# Patient Record
Sex: Female | Born: 1945 | Race: White | Hispanic: No | Marital: Married | Smoking: Never smoker
Health system: Southern US, Community
[De-identification: ages and names within clinical notes are randomized; demographics above are authoritative.]

## PROBLEM LIST (undated history)

## (undated) DIAGNOSIS — R1013 Epigastric pain: Secondary | ICD-10-CM

## (undated) DIAGNOSIS — I10 Essential (primary) hypertension: Secondary | ICD-10-CM

## (undated) DIAGNOSIS — Z973 Presence of spectacles and contact lenses: Secondary | ICD-10-CM

## (undated) DIAGNOSIS — C50919 Malignant neoplasm of unspecified site of unspecified female breast: Secondary | ICD-10-CM

## (undated) DIAGNOSIS — M1990 Unspecified osteoarthritis, unspecified site: Secondary | ICD-10-CM

## (undated) DIAGNOSIS — E7800 Pure hypercholesterolemia, unspecified: Secondary | ICD-10-CM

## (undated) HISTORY — PX: ESOPHAGOGASTRODUODENOSCOPY: SHX1529

## (undated) HISTORY — PX: COLONOSCOPY: SHX174

## (undated) HISTORY — PX: TUBAL LIGATION: SHX77

## (undated) HISTORY — PX: ABDOMINAL HYSTERECTOMY: SHX81

---

## 1996-01-15 DIAGNOSIS — C50919 Malignant neoplasm of unspecified site of unspecified female breast: Secondary | ICD-10-CM

## 1996-01-15 HISTORY — DX: Malignant neoplasm of unspecified site of unspecified female breast: C50.919

## 1996-01-15 HISTORY — PX: MASTECTOMY: SHX3

## 2003-12-20 ENCOUNTER — Ambulatory Visit: Payer: Self-pay | Admitting: Internal Medicine

## 2004-01-24 ENCOUNTER — Ambulatory Visit: Payer: Self-pay | Admitting: Internal Medicine

## 2004-06-22 ENCOUNTER — Ambulatory Visit: Payer: Self-pay | Admitting: Internal Medicine

## 2004-08-13 ENCOUNTER — Ambulatory Visit: Payer: Self-pay | Admitting: General Surgery

## 2005-01-02 ENCOUNTER — Ambulatory Visit: Payer: Self-pay | Admitting: Internal Medicine

## 2005-03-01 ENCOUNTER — Ambulatory Visit: Payer: Self-pay | Admitting: General Surgery

## 2005-04-02 ENCOUNTER — Ambulatory Visit: Payer: Self-pay | Admitting: Internal Medicine

## 2005-06-27 ENCOUNTER — Ambulatory Visit: Payer: Self-pay | Admitting: Internal Medicine

## 2005-12-27 ENCOUNTER — Ambulatory Visit: Payer: Self-pay | Admitting: Internal Medicine

## 2006-03-28 ENCOUNTER — Ambulatory Visit: Payer: Self-pay | Admitting: General Surgery

## 2006-08-16 ENCOUNTER — Emergency Department: Payer: Self-pay | Admitting: Emergency Medicine

## 2006-08-17 ENCOUNTER — Emergency Department: Payer: Self-pay | Admitting: Emergency Medicine

## 2006-08-18 ENCOUNTER — Inpatient Hospital Stay: Payer: Self-pay | Admitting: Internal Medicine

## 2006-08-18 ENCOUNTER — Ambulatory Visit: Payer: Self-pay | Admitting: Internal Medicine

## 2006-08-18 ENCOUNTER — Other Ambulatory Visit: Payer: Self-pay

## 2006-08-19 ENCOUNTER — Other Ambulatory Visit: Payer: Self-pay

## 2006-09-30 ENCOUNTER — Ambulatory Visit: Payer: Self-pay | Admitting: Gastroenterology

## 2006-10-08 ENCOUNTER — Ambulatory Visit: Payer: Self-pay | Admitting: Gastroenterology

## 2006-12-15 ENCOUNTER — Ambulatory Visit: Payer: Self-pay | Admitting: Internal Medicine

## 2006-12-26 ENCOUNTER — Ambulatory Visit: Payer: Self-pay | Admitting: Internal Medicine

## 2007-03-13 ENCOUNTER — Ambulatory Visit: Payer: Self-pay | Admitting: Gastroenterology

## 2007-04-03 ENCOUNTER — Ambulatory Visit: Payer: Self-pay | Admitting: Internal Medicine

## 2008-04-15 ENCOUNTER — Ambulatory Visit: Payer: Self-pay | Admitting: Internal Medicine

## 2008-05-24 IMAGING — CR DG IVP HYPERTENSIVE
1 series · 8 of 10 positions shown · non-contrast
Comparison: none

REASON FOR EXAM: hematuria
COMMENTS:

[Series 1: view not recorded · 0.17mm/px · 8 of 11 slices shown]
[im 1/11]
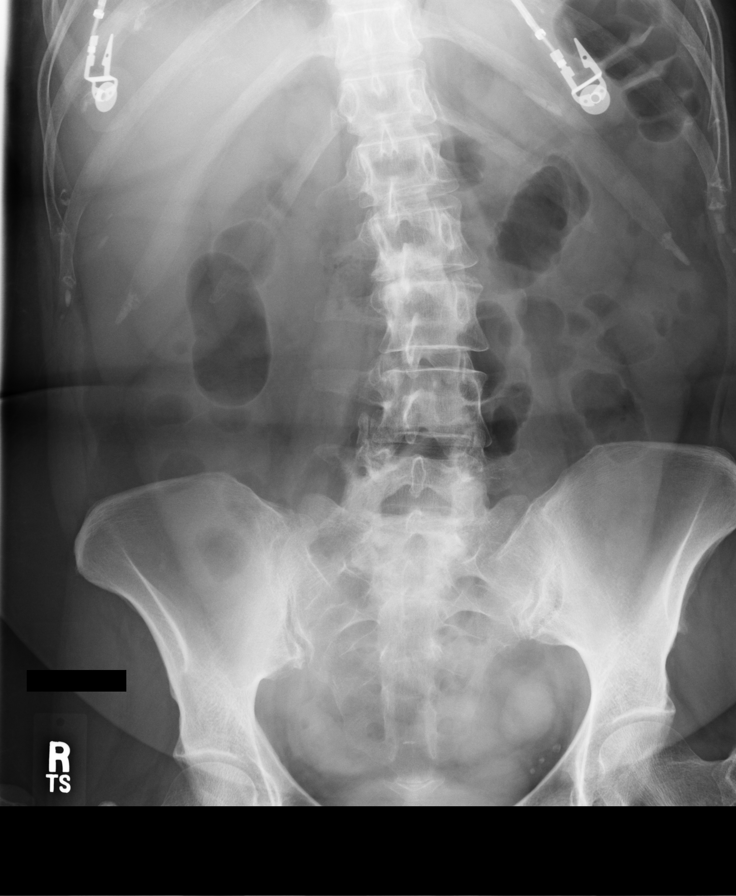
[im 2/11]
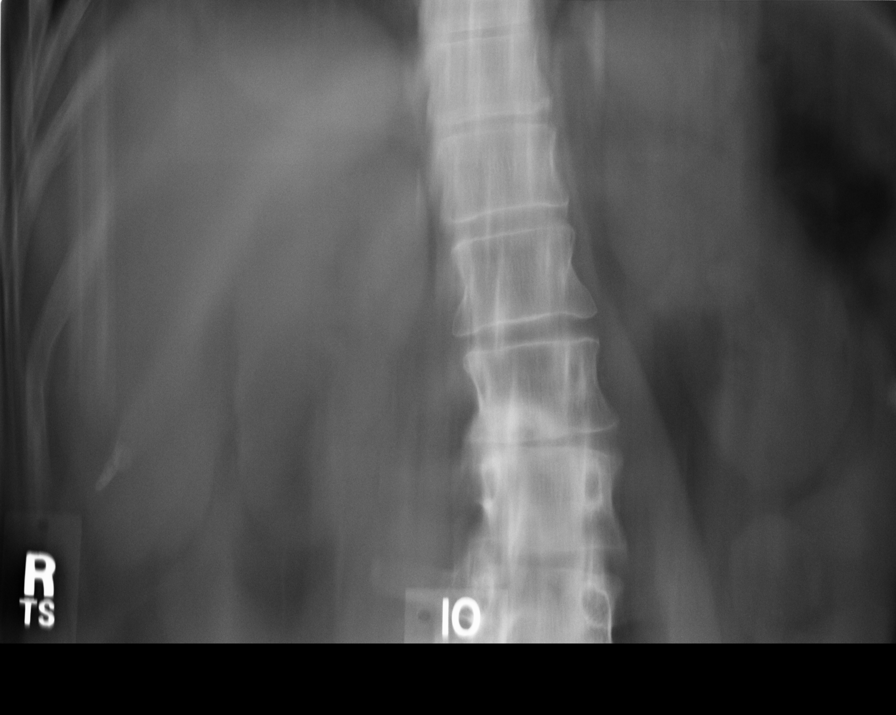
[im 3/11]
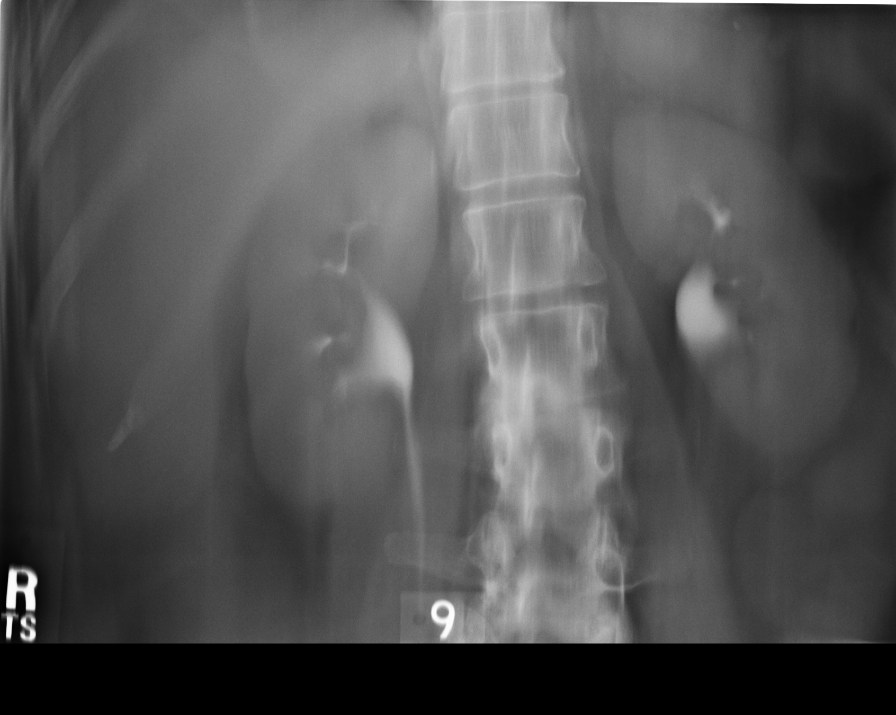
[im 4/11]
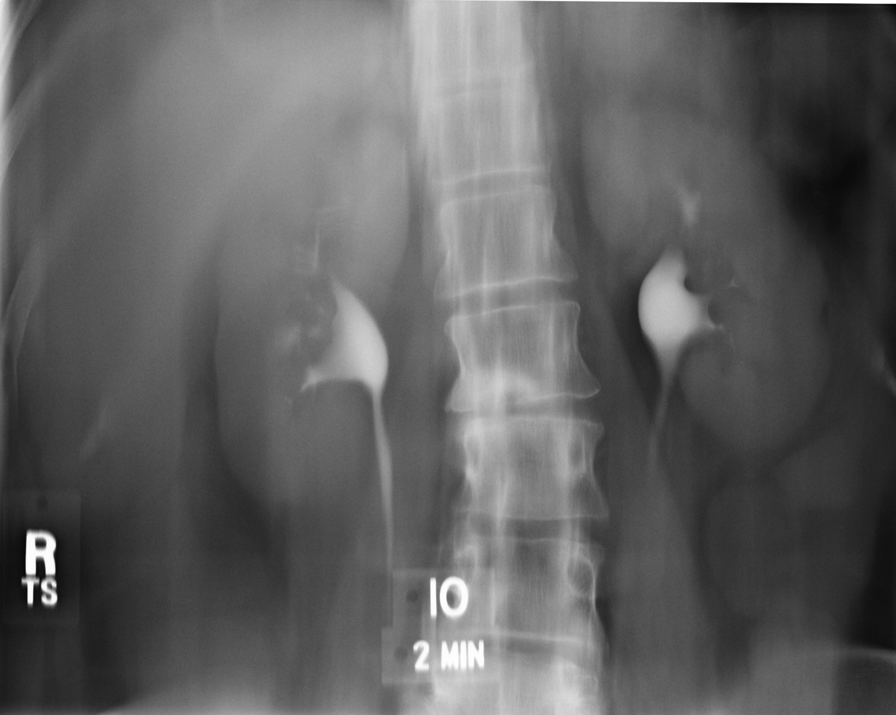
[im 5/11]
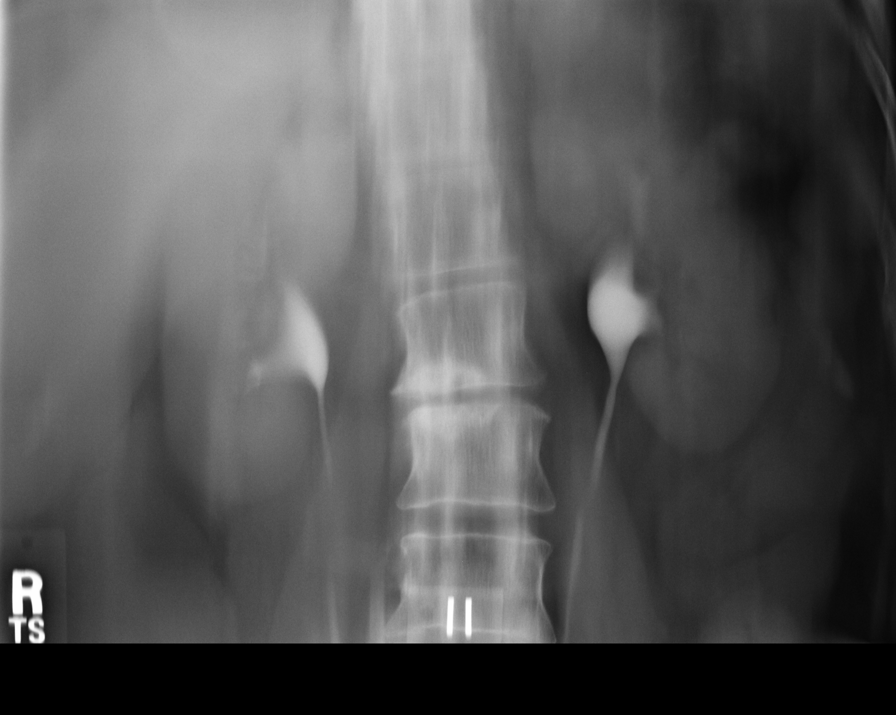
[im 6/11]
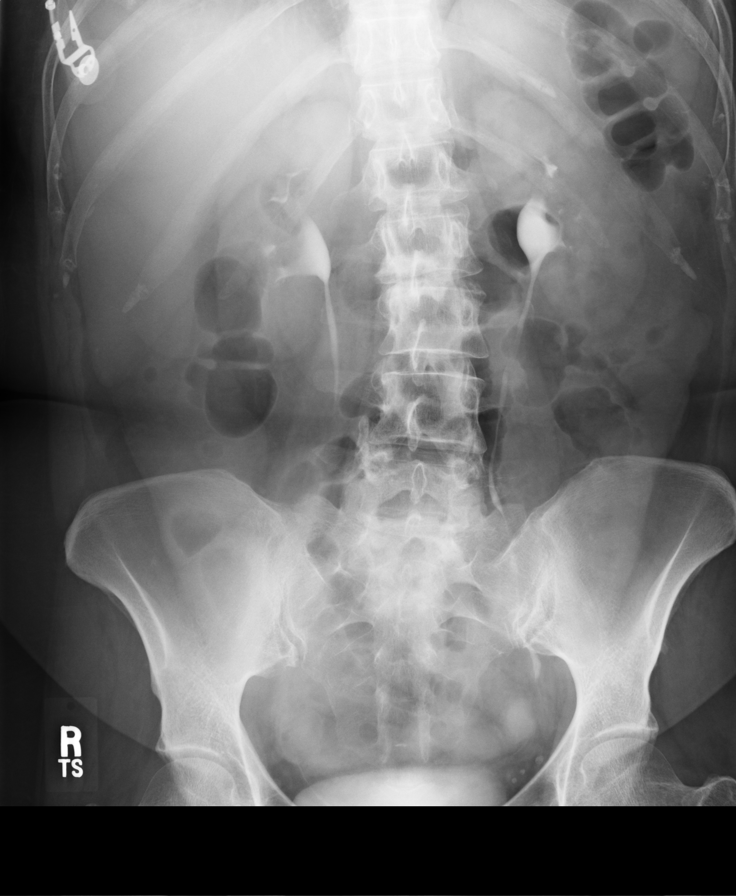
[im 7/11]
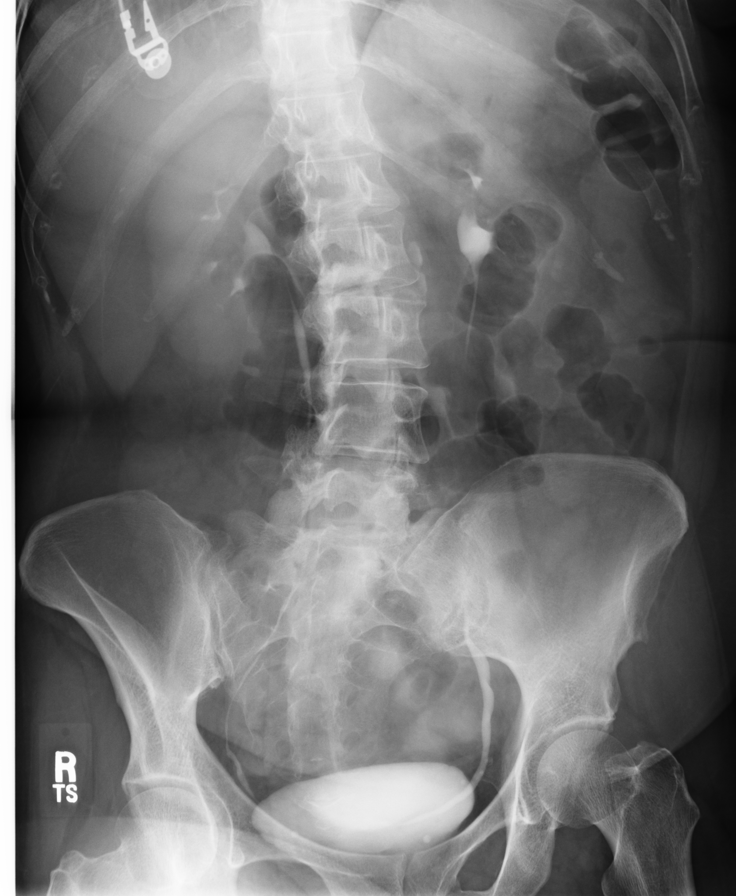
[im 8/11]
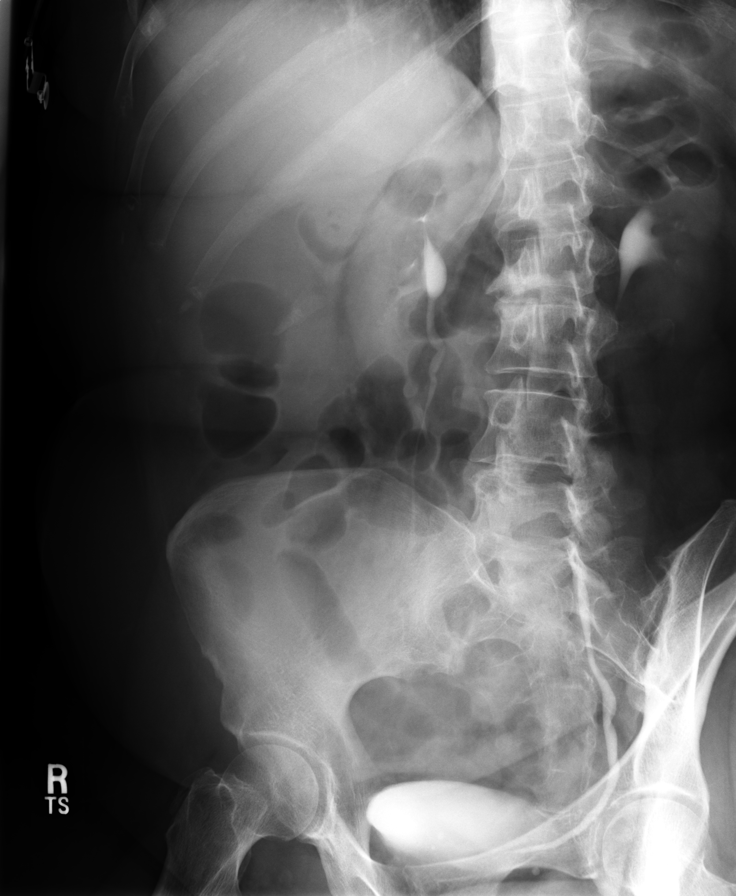

[8 of 10 positions shown; findings below may reference images not displayed]

PROCEDURE:     DXR - DXR INTRAVENOUS UROGRAPHY (IVP)  - August 19, 2006  [DATE]

RESULT:     Scout views demonstrate an unremarkable bowel gas pattern. No
radiopaque stones are evident over either kidney or the areas of the ureters
or bladder. Phleboliths are present in the pelvic region bilaterally. Some
residual opacified urine is seen in the bladder from the previous CT.

Following intravenous administration of 50 cc of iodinated contrast there is
prompt excretion of contrast by both kidneys on the 1 minute tomogram. The
kidneys appear to be smoothly marginated with no evidence of mass. There is
no obstructive change. No stones are evident. The ureters opacify
intermittently. The bladder fills without evidence of filling defect. No
definite renal masses are appreciated. There is a small amount of residual
opacified urine in the bladder with delayed images. The possibility of
postvoid residual volume to be considered.
IMPRESSION: 1.No obstruction or mass demonstrated. A small postvoid residual volume
cannot be excluded.

## 2008-05-24 IMAGING — NM NUCLEAR MEDICINE HEPATOHBILIARY INCLUDE GB
2 series · 11 of 11 positions shown · non-contrast
Comparison: none

REASON FOR EXAM: abd. pain
COMMENTS:

[Series 1000: gallbladder statics · 4.80mm/px · 10 of 10 slices shown (1 of 2)]
[im 1/10]
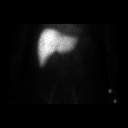
[im 2/10]
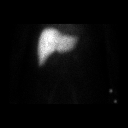
[im 3/10]
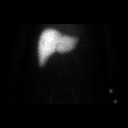
[im 4/10]
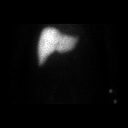
[im 5/10]
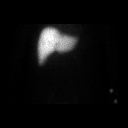
[im 6/10]
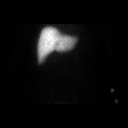
[im 7/10]
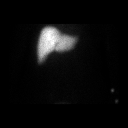
[im 8/10]
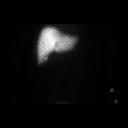
[im 9/10]
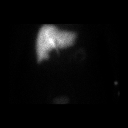
[im 10/10]
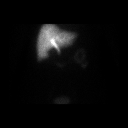

[Series 1000: gallbladder statics · 4.80mm/px · 1 of 1 slices shown (2 of 2)]
[im 1/1]
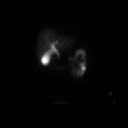

[11 of 11 positions shown; findings below may reference images not displayed]

PROCEDURE:     NM  - NM HEPATOBILIARY IMAGE  - August 19, 2006  [DATE]

RESULT:     The patient received 8.1 mCi of technetium 99 M labeled
Choletec. Static imaging was then performed in the usual fashion.

There is adequate uptake of radiopharmaceutical by the liver. There is some
activity seen within the common bile duct by 90 minutes and a small amount
of bowel activity is seen at 90 minutes. No gallbladder activity was seen.
Images obtained at 4 hours do reveal filling of the gallbladder. There is
more bowel activity visible as well.
IMPRESSION: There is a delay in the appearance of the gallbladder which
was not visualized until the 4 hours series had been completed. I do not see
evidence of common bile duct obstruction. Findings may indicate chronic
cholecystitis.  CCK was not administered.

## 2008-05-25 IMAGING — US ABDOMEN ULTRASOUND LIMITED
1 series · 17 of 25 positions shown · non-contrast
Comparison: none

REASON FOR EXAM: persistent ruq pain, abn CT and HIDa. REeval for
cholecystitis, stones
COMMENTS:

[Series 1: abdomen ultrasound limited · 17 of 43 slices shown]
[im 1/43]
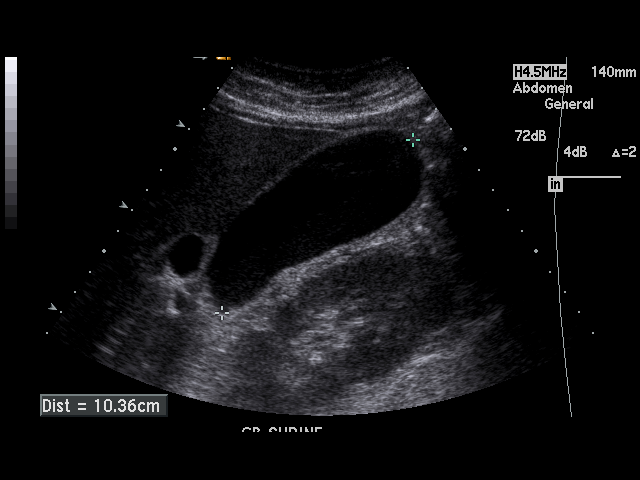
[im 4/43]
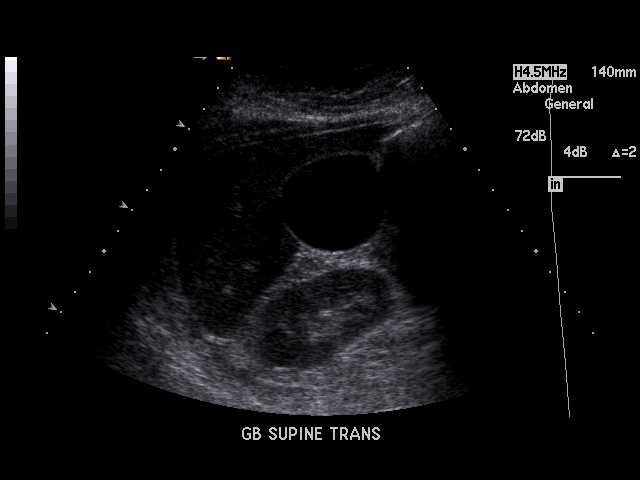
[im 6/43]
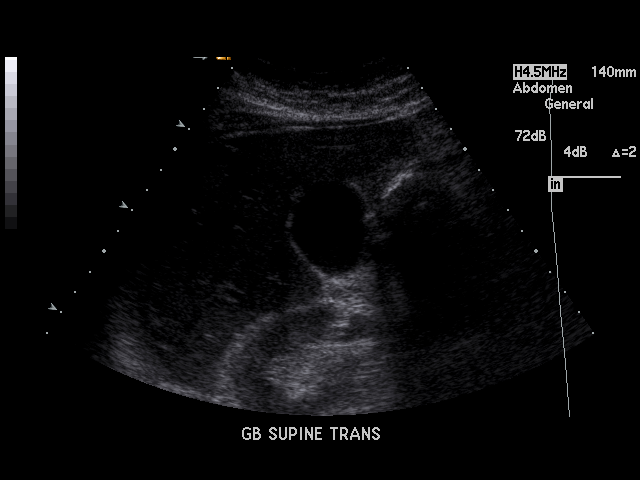
[im 9/43]
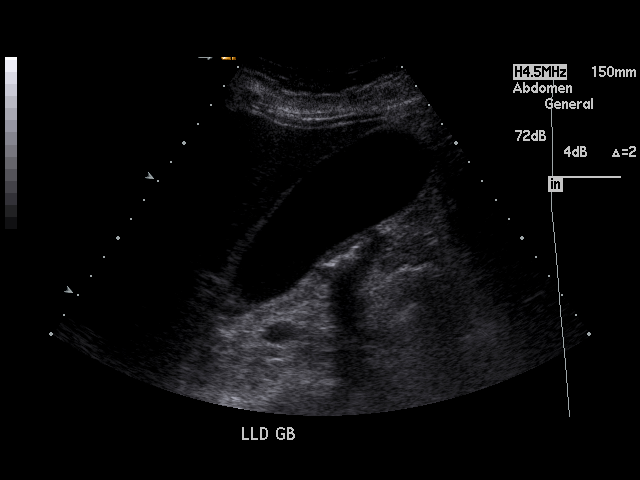
[im 11/43]
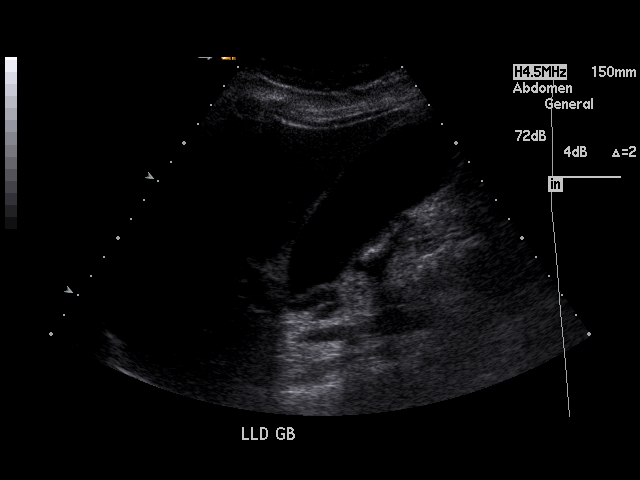
[im 15/43]
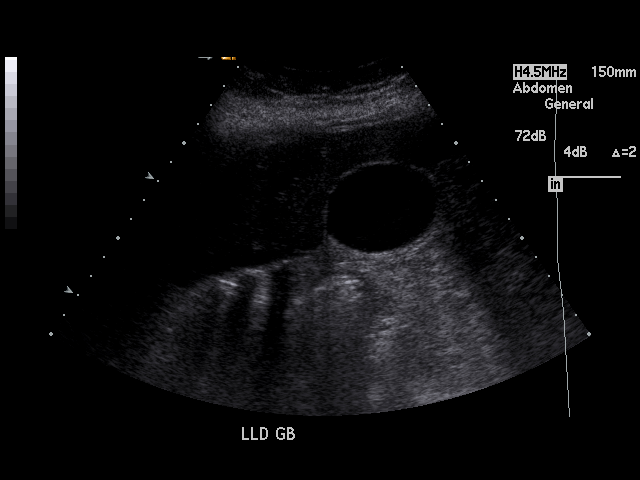
[im 16/43]
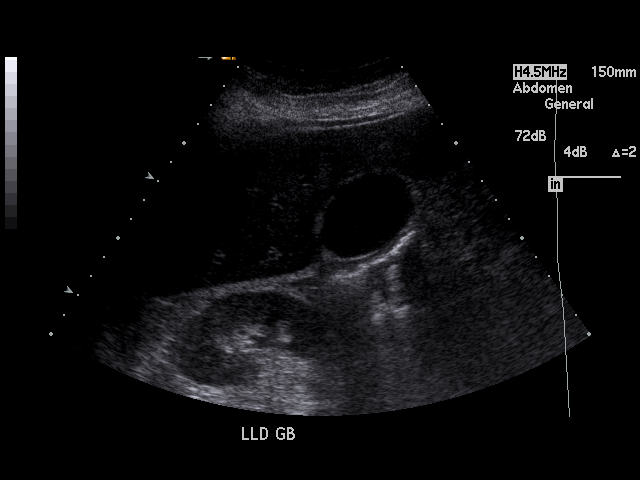
[im 20/43]
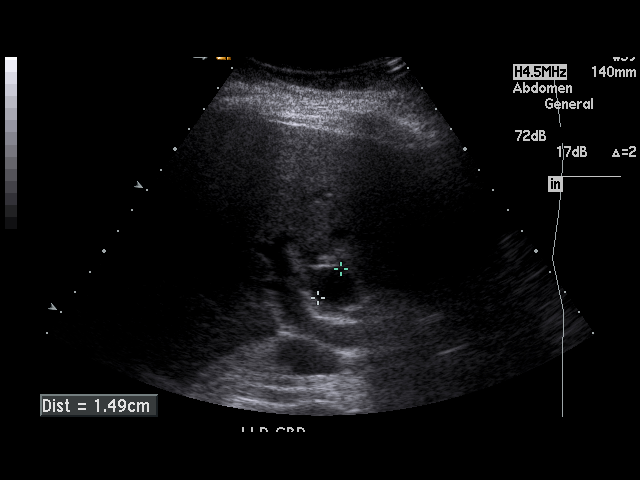
[im 22/43]
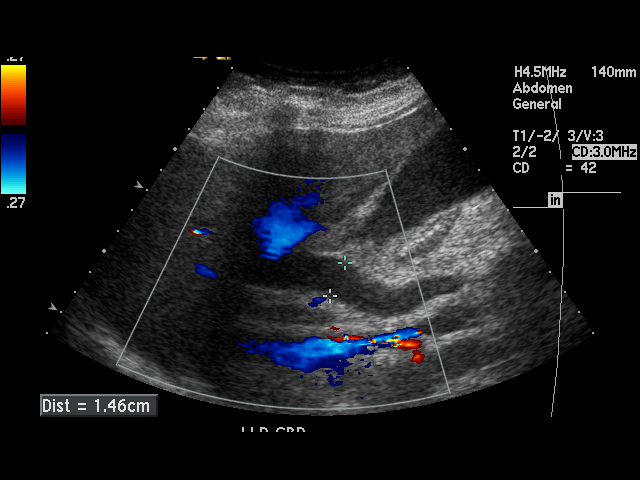
[im 23/43]
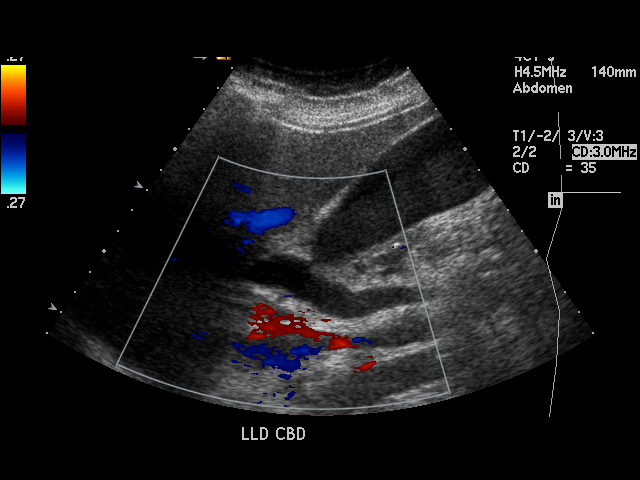
[im 27/43]
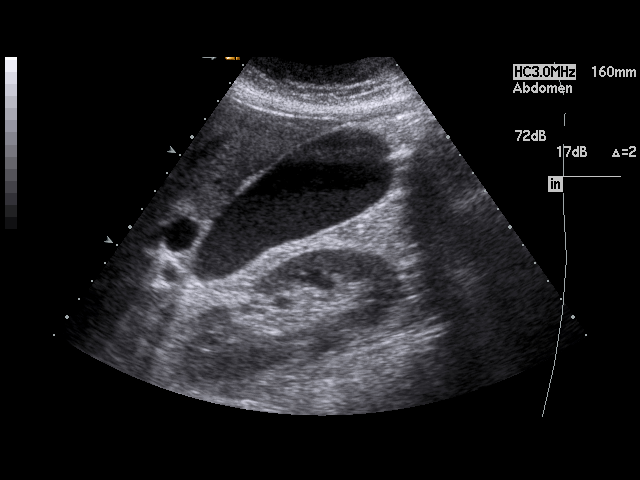
[im 29/43]
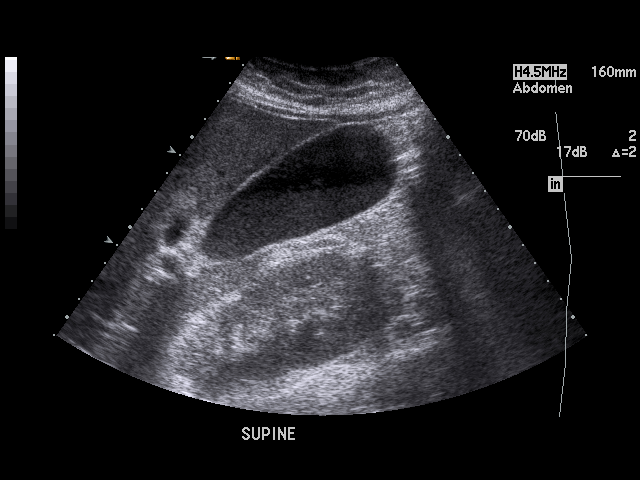
[im 32/43]
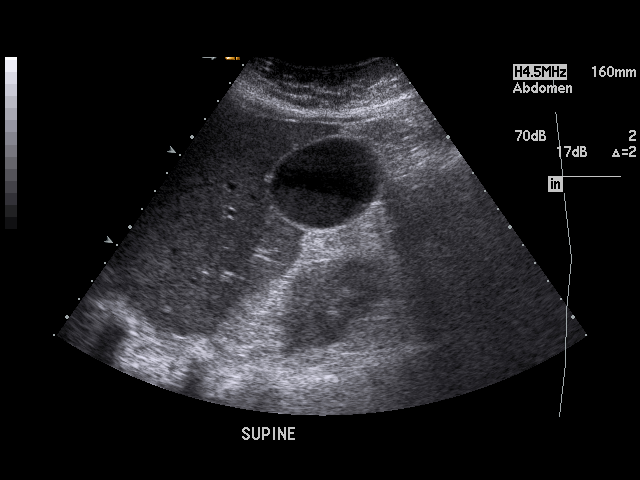
[im 34/43]
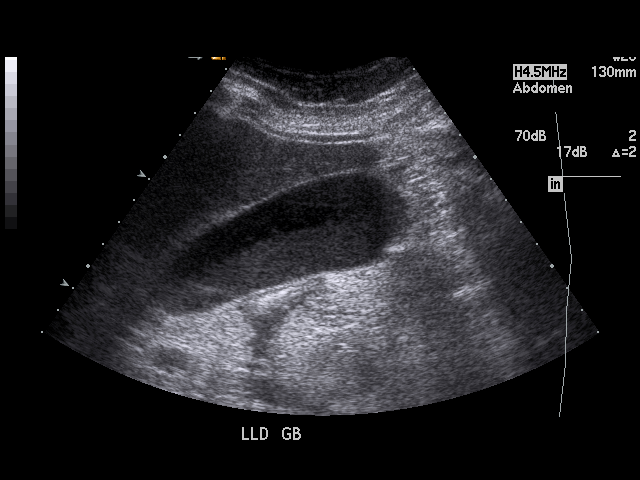
[im 37/43]
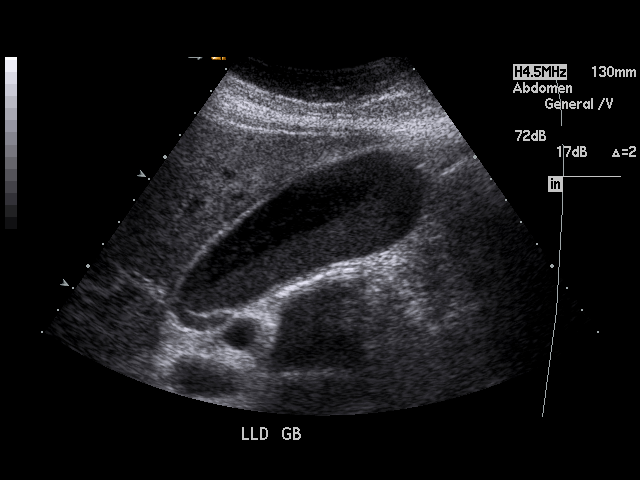
[im 39/43]
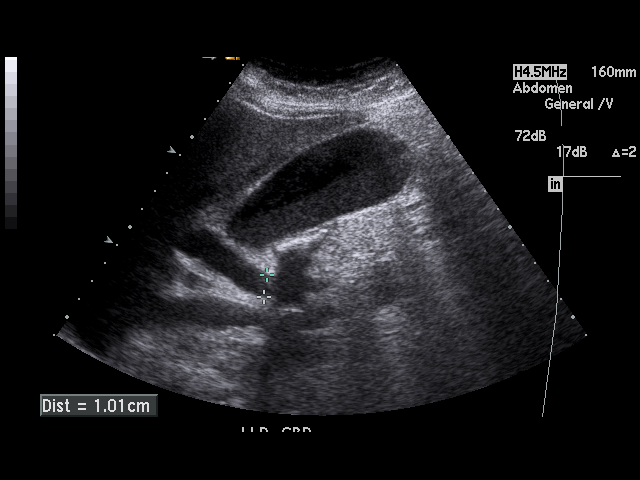
[im 43/43]
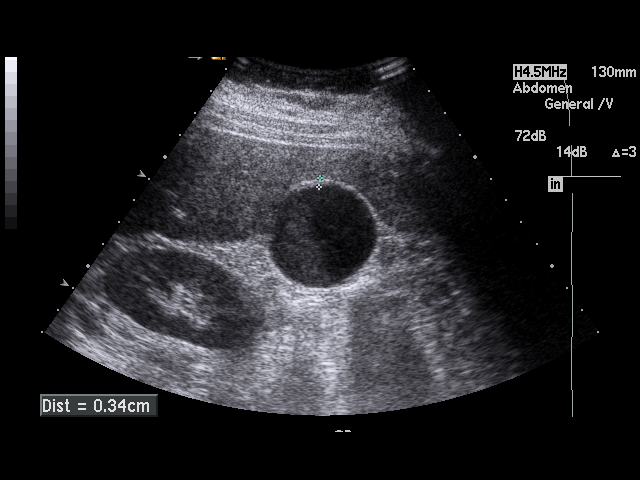

[17 of 25 positions shown; findings below may reference images not displayed]

PROCEDURE:     US  - US ABDOMEN LIMITED SURVEY  - August 20, 2006  [DATE]

RESULT:     Sonographic evaluation of the abdomen limited primarily to the
gallbladder shows the gallbladder wall thickness is 3.4 mm. The common bile
duct is dilated to 6.4 mm but this is near the limits of normal for the
patient's age. Correlation with LFTs is recommended. There is sludge present
within the gallbladder with no shadowing stones evident. There is no
pericholecystic fluid.
IMPRESSION: Predominantly sludge filled gallbladder with no definite
stones. The wall thickness is mildly prominent. No definite sonographic
evidence of cholecystitis is present. Correlation with a hepatobiliary scan
may be beneficial.

## 2009-04-17 ENCOUNTER — Ambulatory Visit: Payer: Self-pay | Admitting: Internal Medicine

## 2010-02-20 ENCOUNTER — Ambulatory Visit: Payer: Self-pay | Admitting: Internal Medicine

## 2010-04-19 ENCOUNTER — Ambulatory Visit: Payer: Self-pay | Admitting: Internal Medicine

## 2010-12-31 ENCOUNTER — Other Ambulatory Visit: Payer: Self-pay | Admitting: Physician Assistant

## 2011-03-04 ENCOUNTER — Ambulatory Visit: Payer: Self-pay | Admitting: Internal Medicine

## 2011-04-23 ENCOUNTER — Ambulatory Visit: Payer: Self-pay | Admitting: Internal Medicine

## 2012-04-24 ENCOUNTER — Ambulatory Visit: Payer: Self-pay | Admitting: Internal Medicine

## 2012-06-17 ENCOUNTER — Ambulatory Visit: Payer: Self-pay | Admitting: Gastroenterology

## 2013-04-27 ENCOUNTER — Ambulatory Visit: Payer: Self-pay | Admitting: Internal Medicine

## 2014-04-28 ENCOUNTER — Ambulatory Visit: Payer: Self-pay | Admitting: Internal Medicine

## 2015-03-16 ENCOUNTER — Other Ambulatory Visit: Payer: Self-pay | Admitting: Internal Medicine

## 2015-03-16 DIAGNOSIS — Z1231 Encounter for screening mammogram for malignant neoplasm of breast: Secondary | ICD-10-CM

## 2015-05-02 ENCOUNTER — Ambulatory Visit
Admission: RE | Admit: 2015-05-02 | Discharge: 2015-05-02 | Disposition: A | Discharge status: Home / Self Care | Payer: Medicare Other | Source: Ambulatory Visit | Attending: Internal Medicine | Admitting: Internal Medicine

## 2015-05-02 DIAGNOSIS — Z1231 Encounter for screening mammogram for malignant neoplasm of breast: Secondary | ICD-10-CM | POA: Insufficient documentation

## 2015-05-02 HISTORY — DX: Malignant neoplasm of unspecified site of unspecified female breast: C50.919

## 2016-01-03 NOTE — Discharge Instructions (Signed)

## 2016-01-08 ENCOUNTER — Encounter: Payer: Self-pay | Admitting: *Deleted

## 2016-01-10 ENCOUNTER — Ambulatory Visit
Admission: RE | Admit: 2016-01-10 | Discharge: 2016-01-10 | Disposition: A | Discharge status: Home / Self Care | Payer: Medicare Other | Source: Ambulatory Visit | Attending: Ophthalmology | Admitting: Ophthalmology

## 2016-01-10 ENCOUNTER — Encounter
Admission: RE | Disposition: A | Discharge status: Home / Self Care | Payer: Self-pay | Source: Ambulatory Visit | Attending: Ophthalmology

## 2016-01-10 ENCOUNTER — Ambulatory Visit: Payer: Medicare Other | Admitting: Anesthesiology

## 2016-01-10 DIAGNOSIS — Z79899 Other long term (current) drug therapy: Secondary | ICD-10-CM | POA: Insufficient documentation

## 2016-01-10 DIAGNOSIS — H2511 Age-related nuclear cataract, right eye: Secondary | ICD-10-CM | POA: Diagnosis not present

## 2016-01-10 DIAGNOSIS — I10 Essential (primary) hypertension: Secondary | ICD-10-CM | POA: Diagnosis not present

## 2016-01-10 HISTORY — PX: CATARACT EXTRACTION W/PHACO: SHX586

## 2016-01-10 HISTORY — DX: Unspecified osteoarthritis, unspecified site: M19.90

## 2016-01-10 HISTORY — DX: Pure hypercholesterolemia, unspecified: E78.00

## 2016-01-10 HISTORY — DX: Epigastric pain: R10.13

## 2016-01-10 HISTORY — DX: Presence of spectacles and contact lenses: Z97.3

## 2016-01-10 SURGERY — PHACOEMULSIFICATION, CATARACT, WITH IOL INSERTION
Anesthesia: Monitor Anesthesia Care | Laterality: Right | Wound class: Clean

## 2016-01-10 MED ORDER — NA HYALUR & NA CHOND-NA HYALUR 0.4-0.35 ML IO KIT
PACK | INTRAOCULAR | Status: DC | PRN
  Administered 2016-01-10: 1 mL via INTRAOCULAR

## 2016-01-10 MED ORDER — ARMC OPHTHALMIC DILATING DROPS
1.0000 "application " | OPHTHALMIC | Status: DC | PRN
  Administered 2016-01-10 (×3): 1 via OPHTHALMIC

## 2016-01-10 MED ORDER — EPINEPHRINE PF 1 MG/ML IJ SOLN
INTRAOCULAR | Status: DC | PRN
  Administered 2016-01-10: 48 mL via OPHTHALMIC

## 2016-01-10 MED ORDER — MIDAZOLAM HCL 2 MG/2ML IJ SOLN
INTRAMUSCULAR | Status: DC | PRN
  Administered 2016-01-10: 2 mg via INTRAVENOUS

## 2016-01-10 MED ORDER — CEFUROXIME OPHTHALMIC INJECTION 1 MG/0.1 ML
INJECTION | OPHTHALMIC | Status: DC | PRN
  Administered 2016-01-10: .3 mL via INTRACAMERAL

## 2016-01-10 MED ORDER — FENTANYL CITRATE (PF) 100 MCG/2ML IJ SOLN
INTRAMUSCULAR | Status: DC | PRN
  Administered 2016-01-10: 50 ug via INTRAVENOUS

## 2016-01-10 MED ORDER — CARBACHOL 0.01 % IO SOLN
INTRAOCULAR | Status: DC | PRN
  Administered 2016-01-10: .3 mL via INTRAOCULAR

## 2016-01-10 MED ORDER — LIDOCAINE HCL (PF) 4 % IJ SOLN
INTRAOCULAR | Status: DC | PRN
  Administered 2016-01-10: 1 mL via OPHTHALMIC

## 2016-01-10 MED ORDER — LACTATED RINGERS IV SOLN: INTRAVENOUS | Status: DC

## 2016-01-10 MED ORDER — MOXIFLOXACIN HCL 0.5 % OP SOLN
1.0000 [drp] | OPHTHALMIC | Status: DC | PRN
  Administered 2016-01-10 (×3): 1 [drp] via OPHTHALMIC

## 2016-01-10 SURGICAL SUPPLY — 25 items
CANNULA ANT/CHMB 27GA (MISCELLANEOUS) ×6 IMPLANT
CARTRIDGE ABBOTT (MISCELLANEOUS) IMPLANT
GLOVE SURG LX 7.5 STRW (GLOVE) ×2
GLOVE SURG LX STRL 7.5 STRW (GLOVE) ×1 IMPLANT
GLOVE SURG TRIUMPH 8.0 PF LTX (GLOVE) ×3 IMPLANT
GOWN STRL REUS W/ TWL LRG LVL3 (GOWN DISPOSABLE) ×2 IMPLANT
GOWN STRL REUS W/TWL LRG LVL3 (GOWN DISPOSABLE) ×4
LENS IOL TECNIS ITEC 12.5 (Intraocular Lens) ×3 IMPLANT
MARKER SKIN DUAL TIP RULER LAB (MISCELLANEOUS) ×3 IMPLANT
NDL RETROBULBAR .5 NSTRL (NEEDLE) IMPLANT
NEEDLE FILTER BLUNT 18X 1/2SAF (NEEDLE) ×4
NEEDLE FILTER BLUNT 18X1 1/2 (NEEDLE) ×2 IMPLANT
PACK CATARACT BRASINGTON (MISCELLANEOUS) ×3 IMPLANT
PACK EYE AFTER SURG (MISCELLANEOUS) ×3 IMPLANT
PACK OPTHALMIC (MISCELLANEOUS) ×3 IMPLANT
RING MALYGIN 7.0 (MISCELLANEOUS) IMPLANT
SUT ETHILON 10-0 CS-B-6CS-B-6 (SUTURE)
SUT VICRYL  9 0 (SUTURE)
SUT VICRYL 9 0 (SUTURE) IMPLANT
SUTURE EHLN 10-0 CS-B-6CS-B-6 (SUTURE) IMPLANT
SYR 3ML LL SCALE MARK (SYRINGE) ×3 IMPLANT
SYR 5ML LL (SYRINGE) ×3 IMPLANT
SYR TB 1ML LUER SLIP (SYRINGE) ×6 IMPLANT
WATER STERILE IRR 250ML POUR (IV SOLUTION) ×3 IMPLANT
WIPE NON LINTING 3.25X3.25 (MISCELLANEOUS) ×3 IMPLANT

## 2016-01-10 NOTE — Op Note (Signed)
LOCATION:  [facility]   PREOPERATIVE DIAGNOSIS:    Nuclear sclerotic cataract right eye. H25.11   POSTOPERATIVE DIAGNOSIS:  Nuclear sclerotic cataract right eye.     PROCEDURE:  Phacoemusification with posterior chamber intraocular lens placement of the right eye   LENS:   Implant Name Type Inv. Item Serial No. Manufacturer Lot No. LRB No. Used  LENS IOL DIOP 12.5 - [id number] Intraocular Lens LENS IOL DIOP 12.5 [id number]   Right 1        ULTRASOUND TIME: 18 % of 1 minutes, 5 seconds.  CDE 11.7   SURGEON:  Wyonia Hough, MD   ANESTHESIA:  Topical with tetracaine drops and 2% Xylocaine jelly, augmented with 1% preservative-free intracameral lidocaine.    COMPLICATIONS:  None.   DESCRIPTION OF PROCEDURE:  The patient was identified in the holding room and transported to the operating room and placed in the supine position under the operating microscope.  The right eye was identified as the operative eye and it was prepped and draped in the usual sterile ophthalmic fashion.   A 1 millimeter clear-corneal paracentesis was made at the 12:00 position.  0.5 ml of preservative-free 1% lidocaine was injected into the anterior chamber. The anterior chamber was filled with Viscoat viscoelastic.  A 2.4 millimeter keratome was used to make a near-clear corneal incision at the 9:00 position.  A curvilinear capsulorrhexis was made with a cystotome and capsulorrhexis forceps.  Balanced salt solution was used to hydrodissect and hydrodelineate the nucleus.   Phacoemulsification was then used in stop and chop fashion to remove the lens nucleus and epinucleus.  The remaining cortex was then removed using the irrigation and aspiration handpiece. Provisc was then placed into the capsular bag to distend it for lens placement.  A lens was then injected into the capsular bag.  The remaining viscoelastic was aspirated.   Wounds were hydrated with balanced salt solution.  The anterior  chamber was inflated to a physiologic pressure with balanced salt solution.  No wound leaks were noted.  Miostat was placed into the anterior chamber.  Cefuroxime 0.1 ml of a 10mg /ml solution was injected into the anterior chamber for a dose of 1 mg of intracameral antibiotic at the completion of the case.  The patient was taken to the recovery room in stable condition without complications of anesthesia or surgery.   Katie Sheppard 01/10/2016, 9:43 AM

## 2016-01-10 NOTE — H&P (Signed)
The History and Physical notes are on paper, have been signed, and are to be scanned. The patient remains stable and unchanged from the H&P.   Previous H&P reviewed, patient examined, and there are no changes.  Katie Sheppard 01/10/2016 8:54 AM

## 2016-01-10 NOTE — Anesthesia Postprocedure Evaluation (Signed)
Anesthesia Post Note  Patient: Katie Sheppard  Procedure(s) Performed: Procedure(s) (LRB): CATARACT EXTRACTION PHACO AND INTRAOCULAR LENS PLACEMENT (IOC) (Right)  Patient location during evaluation: PACU Anesthesia Type: MAC Level of consciousness: awake and alert Pain management: pain level controlled Vital Signs Assessment: post-procedure vital signs reviewed and stable Respiratory status: spontaneous breathing Cardiovascular status: blood pressure returned to baseline Postop Assessment: no headache Anesthetic complications: no    Jaci Standard, III,  Eutha Cude D

## 2016-01-10 NOTE — Anesthesia Preprocedure Evaluation (Signed)
Anesthesia Evaluation  Patient identified by MRN, date of birth, ID band Patient awake    Reviewed: Allergy & Precautions, H&P , NPO status   History of Anesthesia Complications Negative for: history of anesthetic complications  Airway Mallampati: II  TM Distance: >3 FB Neck ROM: full    Dental no notable dental hx.    Pulmonary neg pulmonary ROS,    Pulmonary exam normal        Cardiovascular hypertension, On Medications Normal cardiovascular exam     Neuro/Psych    GI/Hepatic negative GI ROS, Neg liver ROS,   Endo/Other    Renal/GU negative Renal ROS     Musculoskeletal   Abdominal   Peds  Hematology negative hematology ROS (+)   Anesthesia Other Findings   Reproductive/Obstetrics                             Anesthesia Physical Anesthesia Plan  ASA: II  Anesthesia Plan: MAC   Post-op Pain Management:    Induction:   Airway Management Planned:   Additional Equipment:   Intra-op Plan:   Post-operative Plan:   Informed Consent:   Plan Discussed with:   Anesthesia Plan Comments:         Anesthesia Quick Evaluation

## 2016-01-10 NOTE — Transfer of Care (Signed)
Immediate Anesthesia Transfer of Care Note  Patient: Katie Sheppard  Procedure(s) Performed: Procedure(s): CATARACT EXTRACTION PHACO AND INTRAOCULAR LENS PLACEMENT (IOC) (Right)  Patient Location: PACU  Anesthesia Type: MAC  Level of Consciousness: awake, alert  and patient cooperative  Airway and Oxygen Therapy: Patient Spontanous Breathing and Patient connected to supplemental oxygen  Post-op Assessment: Post-op Vital signs reviewed, Patient's Cardiovascular Status Stable, Respiratory Function Stable, Patent Airway and No signs of Nausea or vomiting  Post-op Vital Signs: Reviewed and stable  Complications: No apparent anesthesia complications

## 2016-01-10 NOTE — Anesthesia Procedure Notes (Signed)
Procedure Name: MAC Performed by: Chae Shuster Pre-anesthesia Checklist: Patient identified, Emergency Drugs available, Suction available, Timeout performed and Patient being monitored Patient Re-evaluated:Patient Re-evaluated prior to inductionOxygen Delivery Method: Nasal cannula Placement Confirmation: positive ETCO2     

## 2016-03-19 ENCOUNTER — Other Ambulatory Visit: Payer: Self-pay | Admitting: Internal Medicine

## 2016-03-19 DIAGNOSIS — Z1231 Encounter for screening mammogram for malignant neoplasm of breast: Secondary | ICD-10-CM

## 2016-03-19 DIAGNOSIS — Z853 Personal history of malignant neoplasm of breast: Secondary | ICD-10-CM | POA: Insufficient documentation

## 2016-05-02 ENCOUNTER — Ambulatory Visit: Payer: Medicare Other

## 2016-05-02 ENCOUNTER — Ambulatory Visit
Admission: RE | Admit: 2016-05-02 | Discharge: 2016-05-02 | Disposition: A | Discharge status: Home / Self Care | Payer: Medicare Other | Source: Ambulatory Visit | Attending: Internal Medicine | Admitting: Internal Medicine

## 2016-05-02 ENCOUNTER — Other Ambulatory Visit: Payer: Self-pay | Admitting: Internal Medicine

## 2016-05-02 DIAGNOSIS — Z1231 Encounter for screening mammogram for malignant neoplasm of breast: Secondary | ICD-10-CM

## 2017-03-24 ENCOUNTER — Other Ambulatory Visit: Payer: Self-pay | Admitting: Internal Medicine

## 2017-03-24 DIAGNOSIS — Z1231 Encounter for screening mammogram for malignant neoplasm of breast: Secondary | ICD-10-CM

## 2017-05-06 ENCOUNTER — Ambulatory Visit
Admission: RE | Admit: 2017-05-06 | Discharge: 2017-05-06 | Disposition: A | Discharge status: Home / Self Care | Payer: Medicare Other | Source: Ambulatory Visit | Attending: Internal Medicine | Admitting: Internal Medicine

## 2017-05-06 DIAGNOSIS — Z1231 Encounter for screening mammogram for malignant neoplasm of breast: Secondary | ICD-10-CM | POA: Insufficient documentation

## 2017-10-10 DIAGNOSIS — M1711 Unilateral primary osteoarthritis, right knee: Secondary | ICD-10-CM | POA: Insufficient documentation

## 2017-11-26 ENCOUNTER — Other Ambulatory Visit: Payer: Self-pay

## 2017-11-26 ENCOUNTER — Encounter
Admission: RE | Admit: 2017-11-26 | Discharge: 2017-11-26 | Disposition: A | Discharge status: Home / Self Care | Payer: Medicare Other | Source: Ambulatory Visit | Attending: Orthopedic Surgery | Admitting: Orthopedic Surgery

## 2017-11-26 DIAGNOSIS — Z791 Long term (current) use of non-steroidal anti-inflammatories (NSAID): Secondary | ICD-10-CM | POA: Diagnosis not present

## 2017-11-26 DIAGNOSIS — I10 Essential (primary) hypertension: Secondary | ICD-10-CM | POA: Diagnosis not present

## 2017-11-26 DIAGNOSIS — M1712 Unilateral primary osteoarthritis, left knee: Secondary | ICD-10-CM | POA: Insufficient documentation

## 2017-11-26 DIAGNOSIS — Z79899 Other long term (current) drug therapy: Secondary | ICD-10-CM | POA: Diagnosis not present

## 2017-11-26 DIAGNOSIS — E7800 Pure hypercholesterolemia, unspecified: Secondary | ICD-10-CM | POA: Insufficient documentation

## 2017-11-26 DIAGNOSIS — M8580 Other specified disorders of bone density and structure, unspecified site: Secondary | ICD-10-CM | POA: Diagnosis not present

## 2017-11-26 DIAGNOSIS — Z01818 Encounter for other preprocedural examination: Secondary | ICD-10-CM | POA: Insufficient documentation

## 2017-11-26 DIAGNOSIS — D751 Secondary polycythemia: Secondary | ICD-10-CM | POA: Insufficient documentation

## 2017-11-26 HISTORY — DX: Essential (primary) hypertension: I10

## 2017-11-26 LAB — CBC WITH DIFFERENTIAL/PLATELET
BASOS PCT: 1 %
Basophils Absolute: 0.1 10*3/uL (ref 0–0.1)
Eosinophils Absolute: 0.4 10*3/uL (ref 0–0.7)
Eosinophils Relative: 4 %
HEMATOCRIT: 41.3 % (ref 35.0–47.0)
HEMOGLOBIN: 14.2 g/dL (ref 12.0–16.0)
LYMPHS ABS: 1.2 10*3/uL (ref 1.0–3.6)
Lymphocytes Relative: 10 %
MCH: 33.5 pg (ref 26.0–34.0)
MCHC: 34.4 g/dL (ref 32.0–36.0)
MCV: 97.4 fL (ref 80.0–100.0)
MONO ABS: 0.9 10*3/uL (ref 0.2–0.9)
MONOS PCT: 8 %
NEUTROS ABS: 9.4 10*3/uL — AB (ref 1.4–6.5)
NEUTROS PCT: 77 %
Platelets: 348 10*3/uL (ref 150–440)
RBC: 4.25 MIL/uL (ref 3.80–5.20)
RDW: 11.9 % (ref 11.5–14.5)
WBC: 12 10*3/uL — ABNORMAL HIGH (ref 3.6–11.0)

## 2017-11-26 LAB — TYPE AND SCREEN
ABO/RH(D): A POS
Antibody Screen: NEGATIVE

## 2017-11-26 LAB — COMPREHENSIVE METABOLIC PANEL WITH GFR
ALT: 69 U/L — ABNORMAL HIGH (ref 0–44)
AST: 23 U/L (ref 15–41)
Albumin: 3.7 g/dL (ref 3.5–5.0)
Alkaline Phosphatase: 61 U/L (ref 38–126)
Anion gap: 10 (ref 5–15)
BUN: 15 mg/dL (ref 8–23)
CO2: 26 mmol/L (ref 22–32)
Calcium: 9.6 mg/dL (ref 8.9–10.3)
Chloride: 100 mmol/L (ref 98–111)
Creatinine, Ser: 0.61 mg/dL (ref 0.44–1.00)
GFR calc Af Amer: >60 mL/min
GFR calc non Af Amer: >60 mL/min
Glucose, Bld: 117 mg/dL — ABNORMAL HIGH (ref 70–99)
Potassium: 4 mmol/L (ref 3.5–5.1)
Sodium: 136 mmol/L (ref 135–145)
Total Bilirubin: 0.5 mg/dL (ref 0.3–1.2)
Total Protein: 7.2 g/dL (ref 6.5–8.1)

## 2017-11-26 LAB — URINALYSIS, ROUTINE W REFLEX MICROSCOPIC
BILIRUBIN URINE: NEGATIVE
Glucose, UA: NEGATIVE mg/dL
HGB URINE DIPSTICK: NEGATIVE
Ketones, ur: 5 mg/dL — AB
NITRITE: POSITIVE — AB
PH: 5 (ref 5.0–8.0)
Protein, ur: NEGATIVE mg/dL
SPECIFIC GRAVITY, URINE: 1.02 (ref 1.005–1.030)

## 2017-11-26 LAB — SEDIMENTATION RATE: Sed Rate: 33 mm/h — ABNORMAL HIGH (ref 0–30)

## 2017-11-26 LAB — SURGICAL PCR SCREEN
MRSA, PCR: NEGATIVE
Staphylococcus aureus: NEGATIVE

## 2017-11-26 LAB — C-REACTIVE PROTEIN: CRP: 5.1 mg/dL — ABNORMAL HIGH

## 2017-11-26 LAB — APTT: aPTT: 30 seconds (ref 24–36)

## 2017-11-26 LAB — PROTIME-INR
INR: 0.94
Prothrombin Time: 12.5 s (ref 11.4–15.2)

## 2017-11-26 NOTE — Patient Instructions (Signed)
Your procedure is scheduled on: Monday 12/08/17 Report to [facility]. To find out your arrival time please call [phone] between 1PM - 3PM on Friday 12/05/17.  Remember: Instructions that are not followed completely may result in serious medical risk, up to and including death, or upon the discretion of your surgeon and anesthesiologist your surgery may need to be rescheduled.     _X__ 1. Do not eat food after midnight the night before your procedure.                 No gum chewing or hard candies. You may drink clear liquids up to 2 hours                 before you are scheduled to arrive for your surgery- DO not drink clear                 liquids within 2 hours of the start of your surgery.                 Clear Liquids include:  water, apple juice without pulp, clear carbohydrate                 drink such as Clearfast or Gatorade, Black Coffee or Tea (Do not add                 anything to coffee or tea).  __X__2.  On the morning of surgery brush your teeth with toothpaste and water, you                 may rinse your mouth with mouthwash if you wish.  Do not swallow any              toothpaste of mouthwash.     _X__ 3.  No Alcohol for 24 hours before or after surgery.   _X__ 4.  Do Not Smoke or use e-cigarettes For 24 Hours Prior to Your Surgery.                 Do not use any chewable tobacco products for at least 6 hours prior to                 surgery.  ____  5.  Bring all medications with you on the day of surgery if instructed.   __X__  6.  Notify your doctor if there is any change in your medical condition      (cold, fever, infections).     Do not wear jewelry, make-up, hairpins, clips or nail polish. Do not wear lotions, powders, or perfumes.  Do not shave 48 hours prior to surgery. Men may shave face and neck. Do not bring valuables to the hospital.    [facility] is not responsible for any belongings or  valuables.  Contacts, dentures/partials or body piercings may not be worn into surgery. Bring a case for your contacts, glasses or hearing aids, a denture cup will be supplied. Leave your suitcase in the car. After surgery it may be brought to your room. For patients admitted to the hospital, discharge time is determined by your treatment team.   Patients discharged the day of surgery will not be allowed to drive home.   Please read over the following fact sheets that you were given:   MRSA Information  __X__ Take these medicines the morning of surgery with A SIP OF WATER:  1. none  2. May take tramadol if needed  3.   4.  5.  6.  ____ Fleet Enema (as directed)   __X__ Use CHG Soap/SAGE wipes as directed  ____ Use inhalers on the day of surgery  ____ Stop metformin/Janumet/Farxiga 2 days prior to surgery    ____ Take 1/2 of usual insulin dose the night before surgery. No insulin the morning          of surgery.   ____ Stop Blood Thinners Coumadin/Plavix/Xarelto/Pleta/Pradaxa/Eliquis/Effient/Aspirin  on   Or contact your Surgeon, Cardiologist or Medical Doctor regarding  ability to stop your blood thinners  __X__ Stop Anti-inflammatories 7 days before surgery such as Advil, Ibuprofen, Motrin,  BC or Goodies Powder, Naprosyn, Naproxen, Aleve, Aspirin, MELOXICAM   __X__ Stop all herbal supplements, fish oil or vitamin E until after surgery.  TURMERIC  ____ Bring C-Pap to the hospital.

## 2017-11-26 NOTE — Pre-Procedure Instructions (Signed)
   ECG 12-lead8/21/2019 [facility] Component Name Value Ref Range  Vent Rate (bpm) 70   PR Interval (msec) 158   QRS Interval (msec) 76   QT Interval (msec) 384   QTc (msec) 414   Other Result Information  This result has an attachment that is not available.  Result Narrative  Sinus rhythm with premature atrial complexes  No previous ECGs available I reviewed and concur with this report. Electronically signed EN:IDPOE MD, BERT ([id number]) on 11/25/2017 6:31:24 PM

## 2017-11-27 NOTE — Pre-Procedure Instructions (Signed)
LABS FAXED TO DR HOOTEN 

## 2017-11-28 LAB — URINE CULTURE: Special Requests: NORMAL

## 2017-11-30 NOTE — Discharge Instructions (Signed)
°  Instructions after Total Knee Replacement ° ° Buzz Axel P. Maxim Bedel, Jr., M.D.    ° Dept. of Orthopaedics & Sports Medicine ° [facility] ° [address] ° [place], [place]  [address] ° Phone: [phone]   Fax: [phone] ° °  °DIET: °• Drink plenty of non-alcoholic fluids. °• Resume your normal diet. Include foods high in fiber. ° °ACTIVITY:  °• You may use crutches or a walker with weight-bearing as tolerated, unless instructed otherwise. °• You may be weaned off of the walker or crutches by your Physical Therapist.  °• Do NOT place pillows under the knee. Anything placed under the knee could limit your ability to straighten the knee.   °• Continue doing gentle exercises. Exercising will reduce the pain and swelling, increase motion, and prevent muscle weakness.   °• Please continue to use the TED compression stockings for 6 weeks. You may remove the stockings at night, but should reapply them in the morning. °• Do not drive or operate any equipment until instructed. ° °WOUND CARE:  °• Continue to use the PolarCare or ice packs periodically to reduce pain and swelling. °• You may bathe or shower after the staples are removed at the first office visit following surgery. ° °MEDICATIONS: °• You may resume your regular medications. °• Please take the pain medication as prescribed on the medication. °• Do not take pain medication on an empty stomach. °• You have been given a prescription for a blood thinner (Lovenox or Coumadin). Please take the medication as instructed. (NOTE: After completing a 2 week course of Lovenox, take one Enteric-coated aspirin once a day. This along with elevation will help reduce the possibility of phlebitis in your operated leg.) °• Do not drive or drink alcoholic beverages when taking pain medications. ° °CALL THE OFFICE FOR: °• Temperature above 101 degrees °• Excessive bleeding or drainage on the dressing. °• Excessive swelling, coldness, or paleness of the toes. °• Persistent  nausea and vomiting. ° °FOLLOW-UP:  °• You should have an appointment to return to the office in 10-14 days after surgery. °• Arrangements have been made for continuation of Physical Therapy (either home therapy or outpatient therapy). °  °

## 2017-12-07 ENCOUNTER — Encounter: Payer: Self-pay | Admitting: Orthopedic Surgery

## 2017-12-07 DIAGNOSIS — E785 Hyperlipidemia, unspecified: Secondary | ICD-10-CM | POA: Insufficient documentation

## 2017-12-07 DIAGNOSIS — M8580 Other specified disorders of bone density and structure, unspecified site: Secondary | ICD-10-CM | POA: Insufficient documentation

## 2017-12-07 DIAGNOSIS — R1013 Epigastric pain: Secondary | ICD-10-CM | POA: Insufficient documentation

## 2017-12-07 DIAGNOSIS — I10 Essential (primary) hypertension: Secondary | ICD-10-CM | POA: Insufficient documentation

## 2017-12-07 DIAGNOSIS — D751 Secondary polycythemia: Secondary | ICD-10-CM | POA: Insufficient documentation

## 2017-12-07 MED ORDER — TRANEXAMIC ACID 1000 MG/10ML IV SOLN
1000.0000 mg | INTRAVENOUS | Status: AC
  Administered 2017-12-08: 1000 mg via INTRAVENOUS
  Filled 2017-12-07: qty 1000

## 2017-12-07 MED ORDER — CEFAZOLIN SODIUM-DEXTROSE 2-4 GM/100ML-% IV SOLN
2.0000 g | INTRAVENOUS | Status: AC
  Administered 2017-12-08: 2 g via INTRAVENOUS

## 2017-12-08 ENCOUNTER — Inpatient Hospital Stay: Payer: Medicare Other | Admitting: Certified Registered Nurse Anesthetist

## 2017-12-08 ENCOUNTER — Inpatient Hospital Stay
Admission: RE | Admit: 2017-12-08 | Discharge: 2017-12-10 | DRG: 470 | Disposition: A | Discharge status: Home Health | Payer: Medicare Other | Source: Ambulatory Visit | Attending: Orthopedic Surgery | Admitting: Orthopedic Surgery

## 2017-12-08 ENCOUNTER — Inpatient Hospital Stay: Payer: Medicare Other

## 2017-12-08 ENCOUNTER — Encounter
Admission: RE | Disposition: A | Discharge status: Home Health | Payer: Self-pay | Source: Ambulatory Visit | Attending: Orthopedic Surgery

## 2017-12-08 ENCOUNTER — Other Ambulatory Visit: Payer: Self-pay

## 2017-12-08 DIAGNOSIS — M170 Bilateral primary osteoarthritis of knee: Secondary | ICD-10-CM | POA: Diagnosis present

## 2017-12-08 DIAGNOSIS — Z853 Personal history of malignant neoplasm of breast: Secondary | ICD-10-CM

## 2017-12-08 DIAGNOSIS — Z96652 Presence of left artificial knee joint: Secondary | ICD-10-CM

## 2017-12-08 DIAGNOSIS — I10 Essential (primary) hypertension: Secondary | ICD-10-CM | POA: Diagnosis present

## 2017-12-08 DIAGNOSIS — Z9849 Cataract extraction status, unspecified eye: Secondary | ICD-10-CM

## 2017-12-08 DIAGNOSIS — Z9011 Acquired absence of right breast and nipple: Secondary | ICD-10-CM

## 2017-12-08 DIAGNOSIS — H409 Unspecified glaucoma: Secondary | ICD-10-CM | POA: Diagnosis present

## 2017-12-08 DIAGNOSIS — E7800 Pure hypercholesterolemia, unspecified: Secondary | ICD-10-CM | POA: Diagnosis present

## 2017-12-08 DIAGNOSIS — Z90710 Acquired absence of both cervix and uterus: Secondary | ICD-10-CM

## 2017-12-08 DIAGNOSIS — E785 Hyperlipidemia, unspecified: Secondary | ICD-10-CM | POA: Diagnosis present

## 2017-12-08 DIAGNOSIS — Z823 Family history of stroke: Secondary | ICD-10-CM

## 2017-12-08 DIAGNOSIS — M8580 Other specified disorders of bone density and structure, unspecified site: Secondary | ICD-10-CM | POA: Diagnosis present

## 2017-12-08 DIAGNOSIS — Z888 Allergy status to other drugs, medicaments and biological substances status: Secondary | ICD-10-CM

## 2017-12-08 DIAGNOSIS — M25562 Pain in left knee: Secondary | ICD-10-CM | POA: Diagnosis present

## 2017-12-08 DIAGNOSIS — D751 Secondary polycythemia: Secondary | ICD-10-CM | POA: Diagnosis present

## 2017-12-08 DIAGNOSIS — Z96659 Presence of unspecified artificial knee joint: Secondary | ICD-10-CM

## 2017-12-08 DIAGNOSIS — Z791 Long term (current) use of non-steroidal anti-inflammatories (NSAID): Secondary | ICD-10-CM

## 2017-12-08 HISTORY — PX: KNEE ARTHROPLASTY: SHX992

## 2017-12-08 LAB — ABO/RH: ABO/RH(D): A POS

## 2017-12-08 SURGERY — ARTHROPLASTY, KNEE, TOTAL, USING IMAGELESS COMPUTER-ASSISTED NAVIGATION
Anesthesia: Choice | Laterality: Left

## 2017-12-08 MED ORDER — BISACODYL 10 MG RE SUPP
10.0000 mg | Freq: Every day | RECTAL | Status: DC | PRN
  Administered 2017-12-10: 10 mg via RECTAL
  Filled 2017-12-08: qty 1

## 2017-12-08 MED ORDER — METOCLOPRAMIDE HCL 5 MG/ML IJ SOLN: 5.0000 mg | Freq: Three times a day (TID) | INTRAMUSCULAR | Status: DC | PRN

## 2017-12-08 MED ORDER — FENTANYL CITRATE (PF) 100 MCG/2ML IJ SOLN
INTRAMUSCULAR | Status: AC
  Filled 2017-12-08: qty 2

## 2017-12-08 MED ORDER — SODIUM CHLORIDE FLUSH 0.9 % IV SOLN
INTRAVENOUS | Status: AC
  Filled 2017-12-08: qty 10

## 2017-12-08 MED ORDER — PROPOFOL 500 MG/50ML IV EMUL
INTRAVENOUS | Status: DC | PRN
  Administered 2017-12-08: 50 ug/kg/min via INTRAVENOUS

## 2017-12-08 MED ORDER — CHLORHEXIDINE GLUCONATE 4 % EX LIQD: 60.0000 mL | Freq: Once | CUTANEOUS | Status: DC

## 2017-12-08 MED ORDER — NETARSUDIL-LATANOPROST 0.02-0.005 % OP SOLN
1.0000 [drp] | Freq: Every day | OPHTHALMIC | Status: DC
  Administered 2017-12-08: 1 [drp] via OPHTHALMIC
  Filled 2017-12-08: qty 2.5

## 2017-12-08 MED ORDER — PROPOFOL 500 MG/50ML IV EMUL
INTRAVENOUS | Status: AC
  Filled 2017-12-08: qty 50

## 2017-12-08 MED ORDER — GABAPENTIN 300 MG PO CAPS
300.0000 mg | ORAL_CAPSULE | Freq: Every day | ORAL | Status: DC
  Administered 2017-12-08 – 2017-12-09 (×2): 300 mg via ORAL
  Filled 2017-12-08 (×2): qty 1

## 2017-12-08 MED ORDER — GLYCOPYRROLATE 0.2 MG/ML IJ SOLN
INTRAMUSCULAR | Status: AC
  Filled 2017-12-08: qty 1

## 2017-12-08 MED ORDER — DIPHENHYDRAMINE HCL 12.5 MG/5ML PO ELIX: 12.5000 mg | ORAL_SOLUTION | ORAL | Status: DC | PRN

## 2017-12-08 MED ORDER — SODIUM CHLORIDE 0.9 % IV SOLN
INTRAVENOUS | Status: DC | PRN
  Administered 2017-12-08: 30 ug/min via INTRAVENOUS

## 2017-12-08 MED ORDER — FAMOTIDINE 20 MG PO TABS
ORAL_TABLET | ORAL | Status: AC
  Administered 2017-12-08: 20 mg via ORAL
  Filled 2017-12-08: qty 1

## 2017-12-08 MED ORDER — FERROUS SULFATE 325 (65 FE) MG PO TABS
325.0000 mg | ORAL_TABLET | Freq: Two times a day (BID) | ORAL | Status: DC
  Administered 2017-12-08 – 2017-12-10 (×4): 325 mg via ORAL
  Filled 2017-12-08 (×4): qty 1

## 2017-12-08 MED ORDER — POTASSIUM CHLORIDE CRYS ER 20 MEQ PO TBCR
20.0000 meq | EXTENDED_RELEASE_TABLET | Freq: Two times a day (BID) | ORAL | Status: DC
  Administered 2017-12-08 – 2017-12-10 (×5): 20 meq via ORAL
  Filled 2017-12-08 (×5): qty 1

## 2017-12-08 MED ORDER — MIDAZOLAM HCL 5 MG/5ML IJ SOLN
INTRAMUSCULAR | Status: DC | PRN
  Administered 2017-12-08 (×2): 1 mg via INTRAVENOUS

## 2017-12-08 MED ORDER — OXYCODONE HCL 5 MG PO TABS
5.0000 mg | ORAL_TABLET | ORAL | Status: DC | PRN
  Administered 2017-12-10: 5 mg via ORAL
  Filled 2017-12-08: qty 1

## 2017-12-08 MED ORDER — SODIUM CHLORIDE 0.9 % IV SOLN
INTRAVENOUS | Status: DC
  Administered 2017-12-08 – 2017-12-09 (×2): via INTRAVENOUS

## 2017-12-08 MED ORDER — BRINZOLAMIDE 1 % OP SUSP
1.0000 [drp] | Freq: Two times a day (BID) | OPHTHALMIC | Status: DC
  Filled 2017-12-08: qty 10

## 2017-12-08 MED ORDER — METOPROLOL SUCCINATE ER 25 MG PO TB24
25.0000 mg | ORAL_TABLET | Freq: Every day | ORAL | Status: DC
  Administered 2017-12-08 – 2017-12-09 (×2): 25 mg via ORAL
  Filled 2017-12-08 (×2): qty 1

## 2017-12-08 MED ORDER — BUPIVACAINE HCL (PF) 0.5 % IJ SOLN
INTRAMUSCULAR | Status: AC
  Filled 2017-12-08: qty 10

## 2017-12-08 MED ORDER — PHENOL 1.4 % MT LIQD: 1.0000 | OROMUCOSAL | Status: DC | PRN

## 2017-12-08 MED ORDER — LACTATED RINGERS IV SOLN
INTRAVENOUS | Status: DC
  Administered 2017-12-08: 07:00:00 via INTRAVENOUS

## 2017-12-08 MED ORDER — MENTHOL 3 MG MT LOZG: 1.0000 | LOZENGE | OROMUCOSAL | Status: DC | PRN

## 2017-12-08 MED ORDER — BUPIVACAINE HCL (PF) 0.25 % IJ SOLN
INTRAMUSCULAR | Status: DC | PRN
  Administered 2017-12-08: 60 mL

## 2017-12-08 MED ORDER — BUPIVACAINE HCL (PF) 0.25 % IJ SOLN
INTRAMUSCULAR | Status: AC
  Filled 2017-12-08: qty 60

## 2017-12-08 MED ORDER — MAGNESIUM HYDROXIDE 400 MG/5ML PO SUSP
30.0000 mL | Freq: Every day | ORAL | Status: DC
  Administered 2017-12-08 – 2017-12-09 (×2): 30 mL via ORAL
  Filled 2017-12-08 (×2): qty 30

## 2017-12-08 MED ORDER — ACETAMINOPHEN 325 MG PO TABS: 325.0000 mg | ORAL_TABLET | Freq: Four times a day (QID) | ORAL | Status: DC | PRN

## 2017-12-08 MED ORDER — ACETAMINOPHEN 10 MG/ML IV SOLN
1000.0000 mg | Freq: Four times a day (QID) | INTRAVENOUS | Status: AC
  Administered 2017-12-08 – 2017-12-09 (×4): 1000 mg via INTRAVENOUS
  Filled 2017-12-08 (×4): qty 100

## 2017-12-08 MED ORDER — PROPOFOL 10 MG/ML IV BOLUS
INTRAVENOUS | Status: DC | PRN
  Administered 2017-12-08: 20 mg via INTRAVENOUS

## 2017-12-08 MED ORDER — ONDANSETRON HCL 4 MG PO TABS: 4.0000 mg | ORAL_TABLET | Freq: Four times a day (QID) | ORAL | Status: DC | PRN

## 2017-12-08 MED ORDER — LIDOCAINE HCL (PF) 2 % IJ SOLN
INTRAMUSCULAR | Status: AC
  Filled 2017-12-08: qty 10

## 2017-12-08 MED ORDER — DEXAMETHASONE SODIUM PHOSPHATE 10 MG/ML IJ SOLN
INTRAMUSCULAR | Status: AC
  Administered 2017-12-08: 8 mg via INTRAVENOUS
  Filled 2017-12-08: qty 1

## 2017-12-08 MED ORDER — DEXAMETHASONE SODIUM PHOSPHATE 10 MG/ML IJ SOLN
8.0000 mg | Freq: Once | INTRAMUSCULAR | Status: AC
  Administered 2017-12-08: 8 mg via INTRAVENOUS

## 2017-12-08 MED ORDER — FENTANYL CITRATE (PF) 100 MCG/2ML IJ SOLN
INTRAMUSCULAR | Status: DC | PRN
  Administered 2017-12-08: 100 ug via INTRAVENOUS

## 2017-12-08 MED ORDER — CELECOXIB 200 MG PO CAPS
200.0000 mg | ORAL_CAPSULE | Freq: Two times a day (BID) | ORAL | Status: DC
  Administered 2017-12-08 – 2017-12-10 (×5): 200 mg via ORAL
  Filled 2017-12-08 (×5): qty 1

## 2017-12-08 MED ORDER — FAMOTIDINE 20 MG PO TABS
20.0000 mg | ORAL_TABLET | Freq: Once | ORAL | Status: AC
  Administered 2017-12-08: 20 mg via ORAL

## 2017-12-08 MED ORDER — SENNOSIDES-DOCUSATE SODIUM 8.6-50 MG PO TABS
1.0000 | ORAL_TABLET | Freq: Two times a day (BID) | ORAL | Status: DC
  Administered 2017-12-08 – 2017-12-10 (×5): 1 via ORAL
  Filled 2017-12-08 (×5): qty 1

## 2017-12-08 MED ORDER — FENTANYL CITRATE (PF) 100 MCG/2ML IJ SOLN: 25.0000 ug | INTRAMUSCULAR | Status: DC | PRN

## 2017-12-08 MED ORDER — CEFAZOLIN SODIUM-DEXTROSE 2-4 GM/100ML-% IV SOLN
2.0000 g | Freq: Four times a day (QID) | INTRAVENOUS | Status: AC
  Administered 2017-12-08 – 2017-12-09 (×4): 2 g via INTRAVENOUS
  Filled 2017-12-08 (×4): qty 100

## 2017-12-08 MED ORDER — DIMENHYDRINATE 50 MG PO TABS
50.0000 mg | ORAL_TABLET | Freq: Every evening | ORAL | Status: DC | PRN
  Filled 2017-12-08: qty 1

## 2017-12-08 MED ORDER — BUPIVACAINE HCL (PF) 0.5 % IJ SOLN
INTRAMUSCULAR | Status: DC | PRN
  Administered 2017-12-08: 3 mL

## 2017-12-08 MED ORDER — GABAPENTIN 300 MG PO CAPS
300.0000 mg | ORAL_CAPSULE | Freq: Once | ORAL | Status: AC
  Administered 2017-12-08: 300 mg via ORAL

## 2017-12-08 MED ORDER — ENOXAPARIN SODIUM 30 MG/0.3ML ~~LOC~~ SOLN
30.0000 mg | Freq: Two times a day (BID) | SUBCUTANEOUS | Status: DC
  Administered 2017-12-09 – 2017-12-10 (×3): 30 mg via SUBCUTANEOUS
  Filled 2017-12-08 (×3): qty 0.3

## 2017-12-08 MED ORDER — NEOMYCIN-POLYMYXIN B GU 40-200000 IR SOLN
Status: AC
  Filled 2017-12-08: qty 20

## 2017-12-08 MED ORDER — BUPIVACAINE LIPOSOME 1.3 % IJ SUSP
INTRAMUSCULAR | Status: AC
  Filled 2017-12-08: qty 20

## 2017-12-08 MED ORDER — CELECOXIB 200 MG PO CAPS
ORAL_CAPSULE | ORAL | Status: AC
  Administered 2017-12-08: 400 mg via ORAL
  Filled 2017-12-08: qty 2

## 2017-12-08 MED ORDER — NEOMYCIN-POLYMYXIN B GU 40-200000 IR SOLN
Status: DC | PRN
  Administered 2017-12-08: 14 mL

## 2017-12-08 MED ORDER — ALUM & MAG HYDROXIDE-SIMETH 200-200-20 MG/5ML PO SUSP: 30.0000 mL | ORAL | Status: DC | PRN

## 2017-12-08 MED ORDER — BRINZOLAMIDE 1 % OP SUSP
1.0000 [drp] | Freq: Two times a day (BID) | OPHTHALMIC | Status: DC
  Administered 2017-12-08 – 2017-12-10 (×4): 1 [drp] via OPHTHALMIC
  Filled 2017-12-08: qty 10

## 2017-12-08 MED ORDER — PHENYLEPHRINE HCL 10 MG/ML IJ SOLN
INTRAMUSCULAR | Status: AC
  Filled 2017-12-08: qty 1

## 2017-12-08 MED ORDER — MIDAZOLAM HCL 2 MG/2ML IJ SOLN
INTRAMUSCULAR | Status: AC
  Filled 2017-12-08: qty 2

## 2017-12-08 MED ORDER — TRANEXAMIC ACID 1000 MG/10ML IV SOLN
1000.0000 mg | Freq: Once | INTRAVENOUS | Status: AC
  Administered 2017-12-08: 1000 mg via INTRAVENOUS
  Filled 2017-12-08: qty 1000

## 2017-12-08 MED ORDER — HYDROMORPHONE HCL 1 MG/ML IJ SOLN
0.5000 mg | INTRAMUSCULAR | Status: DC | PRN
  Administered 2017-12-09: 1 mg via INTRAVENOUS
  Filled 2017-12-08: qty 1

## 2017-12-08 MED ORDER — TRAMADOL HCL 50 MG PO TABS
50.0000 mg | ORAL_TABLET | ORAL | Status: DC | PRN
  Administered 2017-12-08 – 2017-12-09 (×2): 100 mg via ORAL
  Administered 2017-12-10: 50 mg via ORAL
  Filled 2017-12-08: qty 2
  Filled 2017-12-08: qty 1
  Filled 2017-12-08: qty 2

## 2017-12-08 MED ORDER — METOCLOPRAMIDE HCL 10 MG PO TABS
10.0000 mg | ORAL_TABLET | Freq: Three times a day (TID) | ORAL | Status: DC
  Administered 2017-12-08 – 2017-12-10 (×7): 10 mg via ORAL
  Filled 2017-12-08 (×6): qty 1

## 2017-12-08 MED ORDER — ACETAMINOPHEN 10 MG/ML IV SOLN
INTRAVENOUS | Status: DC | PRN
  Administered 2017-12-08: 1000 mg via INTRAVENOUS

## 2017-12-08 MED ORDER — ACETAMINOPHEN 10 MG/ML IV SOLN
INTRAVENOUS | Status: AC
  Filled 2017-12-08: qty 100

## 2017-12-08 MED ORDER — ONDANSETRON HCL 4 MG/2ML IJ SOLN: 4.0000 mg | Freq: Once | INTRAMUSCULAR | Status: DC | PRN

## 2017-12-08 MED ORDER — CEFAZOLIN SODIUM-DEXTROSE 2-4 GM/100ML-% IV SOLN
INTRAVENOUS | Status: AC
  Filled 2017-12-08: qty 100

## 2017-12-08 MED ORDER — FLEET ENEMA 7-19 GM/118ML RE ENEM: 1.0000 | ENEMA | Freq: Once | RECTAL | Status: DC | PRN

## 2017-12-08 MED ORDER — GABAPENTIN 300 MG PO CAPS
ORAL_CAPSULE | ORAL | Status: AC
  Administered 2017-12-08: 300 mg via ORAL
  Filled 2017-12-08: qty 1

## 2017-12-08 MED ORDER — SODIUM CHLORIDE 0.9 % IV SOLN
INTRAVENOUS | Status: DC | PRN
  Administered 2017-12-08: 60 mL

## 2017-12-08 MED ORDER — METOCLOPRAMIDE HCL 10 MG PO TABS
5.0000 mg | ORAL_TABLET | Freq: Three times a day (TID) | ORAL | Status: DC | PRN
  Administered 2017-12-08: 10 mg via ORAL
  Filled 2017-12-08: qty 1

## 2017-12-08 MED ORDER — CELECOXIB 200 MG PO CAPS
400.0000 mg | ORAL_CAPSULE | Freq: Once | ORAL | Status: AC
  Administered 2017-12-08: 400 mg via ORAL

## 2017-12-08 MED ORDER — ONDANSETRON HCL 4 MG/2ML IJ SOLN: 4.0000 mg | Freq: Four times a day (QID) | INTRAMUSCULAR | Status: DC | PRN

## 2017-12-08 MED ORDER — PANTOPRAZOLE SODIUM 40 MG PO TBEC
40.0000 mg | DELAYED_RELEASE_TABLET | Freq: Two times a day (BID) | ORAL | Status: DC
  Administered 2017-12-08 – 2017-12-10 (×5): 40 mg via ORAL
  Filled 2017-12-08 (×5): qty 1

## 2017-12-08 MED ORDER — ROSUVASTATIN CALCIUM 10 MG PO TABS
20.0000 mg | ORAL_TABLET | Freq: Every day | ORAL | Status: DC
  Administered 2017-12-08 – 2017-12-09 (×2): 20 mg via ORAL
  Filled 2017-12-08 (×2): qty 2

## 2017-12-08 MED ORDER — OXYCODONE HCL 5 MG PO TABS
10.0000 mg | ORAL_TABLET | ORAL | Status: DC | PRN
  Administered 2017-12-08 – 2017-12-10 (×5): 10 mg via ORAL
  Filled 2017-12-08 (×5): qty 2

## 2017-12-08 SURGICAL SUPPLY — 69 items
ATTUNE MED DOME PAT 32 KNEE (Knees) ×2 IMPLANT
ATTUNE MED DOME PAT 32MM KNEE (Knees) ×1 IMPLANT
ATTUNE PS FEM LT SZ 3 CEM KNEE (Femur) ×3 IMPLANT
ATTUNE PSRP INSR SZ3 7 KNEE (Insert) ×2 IMPLANT
ATTUNE PSRP INSR SZ3 7MM KNEE (Insert) ×1 IMPLANT
BASE TIBIAL ROT PLAT SZ 3 KNEE (Knees) ×1 IMPLANT
BATTERY INSTRU NAVIGATION (MISCELLANEOUS) ×12 IMPLANT
BLADE SAW 70X12.5 (BLADE) ×3 IMPLANT
BLADE SAW 90X13X1.19 OSCILLAT (BLADE) ×3 IMPLANT
BLADE SAW 90X25X1.19 OSCILLAT (BLADE) ×3 IMPLANT
CANISTER SUCT 1200ML W/VALVE (MISCELLANEOUS) ×3 IMPLANT
CANISTER SUCT 3000ML PPV (MISCELLANEOUS) ×6 IMPLANT
CEMENT HV SMART SET (Cement) ×6 IMPLANT
COOLER POLAR GLACIER W/PUMP (MISCELLANEOUS) ×3 IMPLANT
CUFF TOURN 24 STER (MISCELLANEOUS) ×3 IMPLANT
CUFF TOURN 30 STER DUAL PORT (MISCELLANEOUS) IMPLANT
DRAPE SHEET LG 3/4 BI-LAMINATE (DRAPES) ×3 IMPLANT
DRSG DERMACEA 8X12 NADH (GAUZE/BANDAGES/DRESSINGS) ×3 IMPLANT
DRSG OPSITE POSTOP 4X14 (GAUZE/BANDAGES/DRESSINGS) ×3 IMPLANT
DRSG TEGADERM 4X4.75 (GAUZE/BANDAGES/DRESSINGS) ×3 IMPLANT
DURAPREP 26ML APPLICATOR (WOUND CARE) ×6 IMPLANT
ELECT CAUTERY BLADE 6.4 (BLADE) ×3 IMPLANT
ELECT REM PT RETURN 9FT ADLT (ELECTROSURGICAL) ×3
ELECTRODE REM PT RTRN 9FT ADLT (ELECTROSURGICAL) ×1 IMPLANT
EX-PIN ORTHOLOCK NAV 4X150 (PIN) ×6 IMPLANT
GLOVE BIOGEL M STRL SZ7.5 (GLOVE) ×6 IMPLANT
GLOVE BIOGEL PI IND STRL 9 (GLOVE) ×1 IMPLANT
GLOVE BIOGEL PI INDICATOR 9 (GLOVE) ×2
GLOVE INDICATOR 8.0 STRL GRN (GLOVE) ×3 IMPLANT
GLOVE SURG SYN 9.0  PF PI (GLOVE) ×2
GLOVE SURG SYN 9.0 PF PI (GLOVE) ×1 IMPLANT
GOWN STRL REUS W/ TWL LRG LVL3 (GOWN DISPOSABLE) ×2 IMPLANT
GOWN STRL REUS W/TWL 2XL LVL3 (GOWN DISPOSABLE) ×3 IMPLANT
GOWN STRL REUS W/TWL LRG LVL3 (GOWN DISPOSABLE) ×4
HEMOVAC 400CC 10FR (MISCELLANEOUS) ×3 IMPLANT
HOLDER FOLEY CATH W/STRAP (MISCELLANEOUS) ×3 IMPLANT
HOOD PEEL AWAY FLYTE STAYCOOL (MISCELLANEOUS) ×6 IMPLANT
KIT TURNOVER KIT A (KITS) ×3 IMPLANT
KNIFE SCULPS 14X20 (INSTRUMENTS) ×3 IMPLANT
LABEL OR SOLS (LABEL) ×3 IMPLANT
NDL SAFETY ECLIPSE 18X1.5 (NEEDLE) ×1 IMPLANT
NEEDLE HYPO 18GX1.5 SHARP (NEEDLE) ×2
NEEDLE SPNL 20GX3.5 QUINCKE YW (NEEDLE) ×6 IMPLANT
NS IRRIG 500ML POUR BTL (IV SOLUTION) ×3 IMPLANT
PACK TOTAL KNEE (MISCELLANEOUS) ×3 IMPLANT
PAD WRAPON POLAR KNEE (MISCELLANEOUS) ×1 IMPLANT
PIN DRILL QUICK PACK ×3 IMPLANT
PIN FIXATION 1/8DIA X 3INL (PIN) ×9 IMPLANT
PULSAVAC PLUS IRRIG FAN TIP (DISPOSABLE) ×3
SOL .9 NS 3000ML IRR  AL (IV SOLUTION) ×2
SOL .9 NS 3000ML IRR UROMATIC (IV SOLUTION) ×1 IMPLANT
SOL PREP PVP 2OZ (MISCELLANEOUS) ×3
SOLUTION PREP PVP 2OZ (MISCELLANEOUS) ×1 IMPLANT
SPONGE DRAIN TRACH 4X4 STRL 2S (GAUZE/BANDAGES/DRESSINGS) ×3 IMPLANT
STAPLER SKIN PROX 35W (STAPLE) ×3 IMPLANT
STRAP TIBIA SHORT (MISCELLANEOUS) ×3 IMPLANT
SUCTION FRAZIER HANDLE 10FR (MISCELLANEOUS) ×2
SUCTION TUBE FRAZIER 10FR DISP (MISCELLANEOUS) ×1 IMPLANT
SUT VIC AB 0 CT1 36 (SUTURE) ×3 IMPLANT
SUT VIC AB 1 CT1 36 (SUTURE) ×6 IMPLANT
SUT VIC AB 2-0 CT2 27 (SUTURE) ×3 IMPLANT
SYR 20CC LL (SYRINGE) ×3 IMPLANT
SYR 30ML LL (SYRINGE) ×6 IMPLANT
TIBIAL BASE ROT PLAT SZ 3 KNEE (Knees) ×3 IMPLANT
TIP FAN IRRIG PULSAVAC PLUS (DISPOSABLE) ×1 IMPLANT
TOWEL OR 17X26 4PK STRL BLUE (TOWEL DISPOSABLE) ×3 IMPLANT
TOWER CARTRIDGE SMART MIX (DISPOSABLE) ×3 IMPLANT
TRAY FOLEY MTR SLVR 16FR STAT (SET/KITS/TRAYS/PACK) ×3 IMPLANT
WRAPON POLAR PAD KNEE (MISCELLANEOUS) ×3

## 2017-12-08 NOTE — Progress Notes (Signed)
Physical Therapy Evaluation Patient Details Name: Katie Sheppard MRN: [id number] DOB: 10-25-1945 Today's Date: 12/08/2017   History of Present Illness  Pt is s/p L TKA on 12/08/2017. Pt's PMH includes Dyspepsia, HLD, HTN, osteopenia, Polycythemia, primary OA bilateral knees, and breast CA.  Clinical Impression  Pt is a pleasant 72 year old female who was admitted s/p L TKA. Pt performs bed mobility independently, transfers and ambulation with Min A and RW. Tolerates ther-ex without pain inc. Pt demonstrates ability to perform 10 SLRs with independence, therefore does not require KI for mobility. Pt demonstrates deficits with RLE strength, mobility, balance, and activity tolerance. Pt is not at her baseline. Would benefit from skilled PT to address above deficits and promote optimal return to PLOF.      Follow Up Recommendations Home health PT    Equipment Recommendations  None recommended by PT    Recommendations for Other Services       Precautions / Restrictions Precautions Precautions: Fall Restrictions Weight Bearing Restrictions: Yes LLE Weight Bearing: Weight bearing as tolerated      Mobility  Bed Mobility Overal bed mobility: Independent             General bed mobility comments: Pt performs safe bed mobility.  Transfers Overall transfer level: Needs assistance Equipment used: Rolling walker (2 wheeled) Transfers: Sit to/from Stand Sit to Stand: From elevated surface;Min assist         General transfer comment: Pt has fair anteior translation, uses RW for UE support.  Ambulation/Gait Ambulation/Gait assistance: Min assist Gait Distance (Feet): 3 Feet Assistive device: Rolling walker (2 wheeled) Gait Pattern/deviations: Step-to pattern Gait velocity: decreased   General Gait Details: Pt has decreased step length, foot clearance, and heel contact of LLE. Uses RW for UE support to reduce WBing in LLE. Noted mild R lateral trunk lean.   Stairs             Wheelchair Mobility    Modified Rankin (Stroke Patients Only)       Balance Overall balance assessment: Needs assistance Sitting-balance support: No upper extremity supported;Feet supported Sitting balance-Leahy Scale: Good Sitting balance - Comments: Good trunk control demonstrated   Standing balance support: Bilateral upper extremity supported Standing balance-Leahy Scale: Poor Standing balance comment: Pt requires RW for UE support, mild R lateral trunk lean in standing.                             Pertinent Vitals/Pain Pain Assessment: 0-10 Pain Score: 3  Pain Location: L knee Pain Descriptors / Indicators: Sore Pain Intervention(s): Limited activity within patient's tolerance;Monitored during session    Home Living Family/patient expects to be discharged to:: Private residence Living Arrangements: Spouse/significant other(Husband) Available Help at Discharge: Family;Available PRN/intermittently Type of Home: House Home Access: Stairs to enter Entrance Stairs-Rails: None Entrance Stairs-Number of Steps: 3 Home Layout: One level Home Equipment: Walker - 2 wheels;Cane - single point;Grab bars - tub/shower;Bedside commode;Wheelchair - manual      Prior Function Level of Independence: Independent with assistive device(s)         Comments: Per pt independent with all ADL, IADL, and ambulates with SPC.     Hand Dominance   Dominant Hand: Right    Extremity/Trunk Assessment   Upper Extremity Assessment Upper Extremity Assessment: Overall WFL for tasks assessed    Lower Extremity Assessment Lower Extremity Assessment: LLE deficits/detail(RLE WFL (stength and sensation)) LLE Deficits / Details: Strength 3/5  LLE Sensation: WNL       Communication   Communication: No difficulties  Cognition Arousal/Alertness: Awake/alert Behavior During Therapy: WFL for tasks assessed/performed Overall Cognitive Status: Within Functional Limits  for tasks assessed                                        General Comments      Exercises Total Joint Exercises Goniometric ROM: Flex: 74 deg, Ext: lacking 2 deg Other Exercises Other Exercises: Supine: AROM ankle pumps, quad sets, SAQs, SLRs, and hip ABD/ADD. All ther-ex performed 10 reps with VC for technique.   Assessment/Plan    PT Assessment Patient needs continued PT services  PT Problem List Decreased strength;Decreased range of motion;Decreased activity tolerance;Decreased balance;Decreased mobility       PT Treatment Interventions DME instruction;Gait training;Stair training;Functional mobility training;Therapeutic activities;Therapeutic exercise;Balance training;Neuromuscular re-education;Patient/family education    PT Goals (Current goals can be found in the Care Plan section)  Acute Rehab PT Goals Patient Stated Goal: To be independent again and go home. PT Goal Formulation: With patient Potential to Achieve Goals: Good    Frequency BID   Barriers to discharge        Co-evaluation               AM-PAC PT "6 Clicks" Daily Activity  Outcome Measure Difficulty turning over in bed (including adjusting bedclothes, sheets and blankets)?: None Difficulty moving from lying on back to sitting on the side of the bed? : None Difficulty sitting down on and standing up from a chair with arms (e.g., wheelchair, bedside commode, etc,.)?: Unable Help needed moving to and from a bed to chair (including a wheelchair)?: A Little Help needed walking in hospital room?: A Little Help needed climbing 3-5 steps with a railing? : A Lot 6 Click Score: 17    End of Session Equipment Utilized During Treatment: Gait belt Activity Tolerance: Patient tolerated treatment well;No increased pain Patient left: in chair;with call bell/phone within reach;with SCD's reapplied;with chair alarm set;with family/visitor present Nurse Communication: Mobility status PT Visit  Diagnosis: Unsteadiness on feet (R26.81);Muscle weakness (generalized) (M62.81);Pain Pain - Right/Left: Left Pain - part of body: Knee    Time: 1522-1600 PT Time Calculation (min) (ACUTE ONLY): 38 min   Charges:              Algis Downs, SPT    Algis Downs 12/08/2017, 5:19 PM

## 2017-12-08 NOTE — NC FL2 (Signed)
[place] LEVEL OF CARE SCREENING TOOL     IDENTIFICATION  Patient Name: Katie Sheppard Birthdate: [birth year] Sex: female Admission Date (Current Location): 12/08/2017  [place] and [place] Number:  Engineering geologist and Address:  [facility], [address] [place], [place] [address]      Provider Number: [id number]  Attending Physician Name and Address:  Dereck Leep, MD  Relative Name and Phone Number:       Current Level of Care: Hospital Recommended Level of Care: [place] Prior Approval Number:    Date Approved/Denied:   PASRR Number: ([id number])  Discharge Plan: SNF    Current Diagnoses: Patient Active Problem List   Diagnosis Date Noted  . S/P total knee arthroplasty 12/08/2017  . Dyspepsia 12/07/2017  . Hyperlipidemia 12/07/2017  . Hypertension 12/07/2017  . Osteopenia 12/07/2017  . Polycythemia 12/07/2017  . Primary osteoarthritis of both knees 10/10/2017  . History of breast cancer in female 03/19/2016    Orientation RESPIRATION BLADDER Height & Weight     Self, Time, Situation, Place  Normal Continent Weight: 123 lb (55.8 kg) Height:  5' (152.4 cm)  BEHAVIORAL SYMPTOMS/MOOD NEUROLOGICAL BOWEL NUTRITION STATUS      Continent Diet(Diet: Full Liquid to be Advanced. )  AMBULATORY STATUS COMMUNICATION OF NEEDS Skin   Extensive Assist Verbally Surgical wounds(Incision: Left Knee. )                       Personal Care Assistance Level of Assistance  Bathing, Feeding, Dressing Bathing Assistance: Limited assistance Feeding assistance: Independent Dressing Assistance: Limited assistance     Functional Limitations Info  Sight, Hearing, Speech Sight Info: Adequate Hearing Info: Adequate Speech Info: Adequate    SPECIAL CARE FACTORS FREQUENCY  PT (By licensed PT), OT (By licensed OT)     PT Frequency: (5) OT Frequency: (5)            Contractures      Additional  Factors Info  Code Status, Allergies Code Status Info: (Full Code. ) Allergies Info: (Dorzolamide Hcl-timolol Mal, Atorvastatin, Brimonidine Tartrate- timolol, Pravastatin)           Current Medications (12/08/2017):  This is the current hospital active medication list Current Facility-Administered Medications  Medication Dose Route Frequency Provider Last Rate Last Dose  . 0.9 %  sodium chloride infusion   Intravenous Continuous Hooten, Laurice Record, MD 100 mL/hr at 12/08/17 1045    . acetaminophen (OFIRMEV) IV 1,000 mg  1,000 mg Intravenous Q6H Hooten, Laurice Record, MD      . Derrill Memo ON 12/09/2017] acetaminophen (TYLENOL) tablet 325-650 mg  325-650 mg Oral Q6H PRN Hooten, Laurice Record, MD      . alum & mag hydroxide-simeth (MAALOX/MYLANTA) 200-200-20 MG/5ML suspension 30 mL  30 mL Oral Q4H PRN Hooten, Laurice Record, MD      . bisacodyl (DULCOLAX) suppository 10 mg  10 mg Rectal Daily PRN Hooten, Laurice Record, MD      . brinzolamide (AZOPT) 1 % ophthalmic suspension 1 drop  1 drop Both Eyes BID Hooten, Laurice Record, MD      . ceFAZolin (ANCEF) IVPB 2g/100 mL premix  2 g Intravenous Q6H Hooten, Laurice Record, MD 200 mL/hr at 12/08/17 1442 2 g at 12/08/17 1442  . celecoxib (CELEBREX) capsule 200 mg  200 mg Oral BID Dereck Leep, MD   200 mg at 12/08/17 1437  . dimenhyDRINATE (DRAMAMINE) tablet 50 mg  50  mg Oral QHS PRN Hooten, Laurice Record, MD      . diphenhydrAMINE (BENADRYL) 12.5 MG/5ML elixir 12.5-25 mg  12.5-25 mg Oral Q4H PRN Hooten, Laurice Record, MD      . Derrill Memo ON 12/09/2017] enoxaparin (LOVENOX) injection 30 mg  30 mg Subcutaneous Q12H Hooten, Laurice Record, MD      . ferrous sulfate tablet 325 mg  325 mg Oral BID WC Hooten, Laurice Record, MD      . gabapentin (NEURONTIN) capsule 300 mg  300 mg Oral QHS Hooten, Laurice Record, MD      . HYDROmorphone (DILAUDID) injection 0.5-1 mg  0.5-1 mg Intravenous Q4H PRN Hooten, Laurice Record, MD      . magnesium hydroxide (MILK OF MAGNESIA) suspension 30 mL  30 mL Oral Daily Hooten, Laurice Record, MD   30 mL at 12/08/17  1437  . menthol-cetylpyridinium (CEPACOL) lozenge 3 mg  1 lozenge Oral PRN Hooten, Laurice Record, MD       Or  . phenol (CHLORASEPTIC) mouth spray 1 spray  1 spray Mouth/Throat PRN Hooten, Laurice Record, MD      . metoCLOPramide (REGLAN) tablet 5-10 mg  5-10 mg Oral Q8H PRN Hooten, Laurice Record, MD       Or  . metoCLOPramide (REGLAN) injection 5-10 mg  5-10 mg Intravenous Q8H PRN Hooten, Laurice Record, MD      . metoCLOPramide (REGLAN) tablet 10 mg  10 mg Oral TID AC & HS Hooten, Laurice Record, MD      . metoprolol succinate (TOPROL-XL) 24 hr tablet 25 mg  25 mg Oral QHS Hooten, Laurice Record, MD      . Netarsudil-Latanoprost 0.02-0.005 % SOLN 1 drop  1 drop Both Eyes QHS Hooten, Laurice Record, MD      . ondansetron (ZOFRAN) tablet 4 mg  4 mg Oral Q6H PRN Hooten, Laurice Record, MD       Or  . ondansetron (ZOFRAN) injection 4 mg  4 mg Intravenous Q6H PRN Hooten, Laurice Record, MD      . oxyCODONE (Oxy IR/ROXICODONE) immediate release tablet 10 mg  10 mg Oral Q4H PRN Hooten, Laurice Record, MD      . oxyCODONE (Oxy IR/ROXICODONE) immediate release tablet 5 mg  5 mg Oral Q4H PRN Hooten, Laurice Record, MD      . pantoprazole (PROTONIX) EC tablet 40 mg  40 mg Oral BID Dereck Leep, MD   40 mg at 12/08/17 1437  . potassium chloride SA (K-DUR,KLOR-CON) CR tablet 20 mEq  20 mEq Oral BID Dereck Leep, MD   20 mEq at 12/08/17 1437  . rosuvastatin (CRESTOR) tablet 20 mg  20 mg Oral QHS Hooten, Laurice Record, MD      . senna-docusate (Senokot-S) tablet 1 tablet  1 tablet Oral BID Hooten, Laurice Record, MD   1 tablet at 12/08/17 1437  . sodium phosphate (FLEET) 7-19 GM/118ML enema 1 enema  1 enema Rectal Once PRN Hooten, Laurice Record, MD      . traMADol Veatrice Bourbon) tablet 50-100 mg  50-100 mg Oral Q4H PRN Dereck Leep, MD   100 mg at 12/08/17 1437     Discharge Medications: Please see discharge summary for a list of discharge medications.  Relevant Imaging Results:  Relevant Lab Results:   Additional Information (SSN: [id number])  Kaisey Huseby, Veronia Beets, LCSW

## 2017-12-08 NOTE — Op Note (Signed)
OPERATIVE NOTE  DATE OF SURGERY:  12/08/2017  PATIENT NAME:  Katie Sheppard   DOB: 1945/11/16  MRN: [id number]  PRE-OPERATIVE DIAGNOSIS: Degenerative arthrosis of the left knee, primary  POST-OPERATIVE DIAGNOSIS:  Same  PROCEDURE:  Left total knee arthroplasty using computer-assisted navigation  SURGEON:  Marciano Sequin. M.D.  ASSISTANT:  Vance Peper, PA (present and scrubbed throughout the case, critical for assistance with exposure, retraction, instrumentation, and closure)  ANESTHESIA: spinal  ESTIMATED BLOOD LOSS: 50 mL  FLUIDS REPLACED: 850 mL of crystalloid  TOURNIQUET TIME: 79 minutes  DRAINS: 2 medium Hemovac drains  SOFT TISSUE RELEASES: Anterior cruciate ligament, posterior cruciate ligament, deep medial collateral ligament, patellofemoral ligament, posterolateral corner  IMPLANTS UTILIZED: DePuy Attune size 3 posterior stabilized femoral component (cemented), size 3 rotating platform tibial component (cemented), 32 mm medialized dome patella (cemented), and a 7 mm stabilized rotating platform polyethylene insert.  INDICATIONS FOR SURGERY: Katie Sheppard is a 72 y.o. year old female with a long history of progressive knee pain. X-rays demonstrated severe degenerative changes in tricompartmental fashion. The patient had not seen any significant improvement despite conservative nonsurgical intervention. After discussion of the risks and benefits of surgical intervention, the patient expressed understanding of the risks benefits and agree with plans for total knee arthroplasty.   The risks, benefits, and alternatives were discussed at length including but not limited to the risks of infection, bleeding, nerve injury, stiffness, blood clots, the need for revision surgery, cardiopulmonary complications, among others, and they were willing to proceed.  PROCEDURE IN DETAIL: The patient was brought into the operating room and, after adequate spinal anesthesia was  achieved, a tourniquet was placed on the patient's upper thigh. The patient's knee and leg were cleaned and prepped with alcohol and DuraPrep and draped in the usual sterile fashion. A "timeout" was performed as per usual protocol. The lower extremity was exsanguinated using an Esmarch, and the tourniquet was inflated to 300 mmHg. An anterior longitudinal incision was made followed by a standard mid vastus approach. The deep fibers of the medial collateral ligament were elevated in a subperiosteal fashion off of the medial flare of the tibia so as to maintain a continuous soft tissue sleeve. The patella was subluxed laterally and the patellofemoral ligament was incised. Inspection of the knee demonstrated severe degenerative changes with full-thickness loss of articular cartilage. Osteophytes were debrided using a rongeur. Anterior and posterior cruciate ligaments were excised. Two 4.0 mm Schanz pins were inserted in the femur and into the tibia for attachment of the array of trackers used for computer-assisted navigation. Hip center was identified using a circumduction technique. Distal landmarks were mapped using the computer. The distal femur and proximal tibia were mapped using the computer. The distal femoral cutting guide was positioned using computer-assisted navigation so as to achieve a 5 distal valgus cut. The femur was sized and it was felt that a size 3 femoral component was appropriate. A size 3 femoral cutting guide was positioned and the anterior cut was performed and verified using the computer. This was followed by completion of the posterior and chamfer cuts. Femoral cutting guide for the central box was then positioned in the center box cut was performed.  Attention was then directed to the proximal tibia. Medial and lateral menisci were excised. The extramedullary tibial cutting guide was positioned using computer-assisted navigation so as to achieve a 0 varus-valgus alignment and 3  posterior slope. The cut was performed and verified using the computer. The  proximal tibia was sized and it was felt that a size 3 tibial tray was appropriate. Tibial and femoral trials were inserted followed by insertion of a 7 mm polyethylene insert. The knee was felt to be tight laterally.  The trial components were removed and the knee was brought into full extension and distracted using the [place] retractors.  The posterolateral corner was carefully released using a combination of electrocautery and Metzenbaum scissors.  Trial components were reinserted followed by placement of a 7 mm polyethylene trial.  This allowed for excellent mediolateral soft tissue balancing both in flexion and in full extension. Finally, the patella was cut and prepared so as to accommodate a 32 mm medialized dome patella. A patella trial was placed and the knee was placed through a range of motion with excellent patellar tracking appreciated. The femoral trial was removed after debridement of posterior osteophytes. The central post-hole for the tibial component was reamed followed by insertion of a keel punch. Tibial trials were then removed. Cut surfaces of bone were irrigated with copious amounts of normal saline with antibiotic solution using pulsatile lavage and then suctioned dry. Polymethylmethacrylate cement was prepared in the usual fashion using a vacuum mixer. Cement was applied to the cut surface of the proximal tibia as well as along the undersurface of a size 3 rotating platform tibial component. Tibial component was positioned and impacted into place. Excess cement was removed using [employer] fast streamer. Cement was then applied to the cut surfaces of the femur as well as along the posterior flanges of the size 3 femoral component. The femoral component was positioned and impacted into place. Excess cement was removed using [employer] fast streamer. A 7 mm polyethylene trial was inserted and the knee was brought into full extension  with steady axial compression applied. Finally, cement was applied to the backside of a 32 mm medialized dome patella and the patellar component was positioned and patellar clamp applied. Excess cement was removed using [employer] fast streamer. After adequate curing of the cement, the tourniquet was deflated after a total tourniquet time of 79 minutes. Hemostasis was achieved using electrocautery. The knee was irrigated with copious amounts of normal saline with antibiotic solution using pulsatile lavage and then suctioned dry. 20 mL of 1.3% Exparel and 60 mL of 0.25% Marcaine in 40 mL of normal saline was injected along the posterior capsule, medial and lateral gutters, and along the arthrotomy site. A 7 mm stabilized rotating platform polyethylene insert was inserted and the knee was placed through a range of motion with excellent mediolateral soft tissue balancing appreciated and excellent patellar tracking noted. 2 medium drains were placed in the wound bed and brought out through separate stab incisions. The medial parapatellar portion of the incision was reapproximated using interrupted sutures of #1 Vicryl. Subcutaneous tissue was approximated in layers using first #0 Vicryl followed #2-0 Vicryl. The skin was approximated with skin staples. A sterile dressing was applied.  The patient tolerated the procedure well and was transported to the recovery room in stable condition.    James P. Holley Bouche., M.D.

## 2017-12-08 NOTE — Anesthesia Post-op Follow-up Note (Signed)
Anesthesia QCDR form completed.        

## 2017-12-08 NOTE — H&P (Signed)
The patient has been re-examined, and the chart reviewed, and there have been no interval changes to the documented history and physical.    The risks, benefits, and alternatives have been discussed at length. The patient expressed understanding of the risks benefits and agreed with plans for surgical intervention.  James P. Hooten, Jr. M.D.    

## 2017-12-08 NOTE — Anesthesia Procedure Notes (Signed)
Spinal  Patient location during procedure: OR Start time: 12/08/2017 7:20 AM End time: 12/08/2017 7:24 AM Staffing Resident/CRNA: Bernardo Heater, CRNA Performed: resident/CRNA  Preanesthetic Checklist Completed: patient identified, site marked, surgical consent, pre-op evaluation, timeout performed, IV checked, risks and benefits discussed and monitors and equipment checked Spinal Block Patient position: sitting Prep: ChloraPrep Patient monitoring: heart rate, continuous pulse ox, blood pressure and cardiac monitor Approach: midline Location: L4-5 Injection technique: single-shot Needle Needle type: Introducer and Pencan  Needle gauge: 24 G Needle length: 9 cm Additional Notes Negative paresthesia. Negative blood return. Positive free-flowing CSF. Expiration date of kit checked and confirmed. Patient tolerated procedure well, without complications.

## 2017-12-08 NOTE — Anesthesia Preprocedure Evaluation (Signed)
Anesthesia Evaluation  Patient identified by MRN, date of birth, ID band Patient awake    Reviewed: Allergy & Precautions, H&P , NPO status , Patient's Chart, lab work & pertinent test results, reviewed documented beta blocker date and time   Airway Mallampati: II   Neck ROM: full    Dental  (+) Teeth Intact   Pulmonary neg pulmonary ROS,    Pulmonary exam normal        Cardiovascular Exercise Tolerance: Good hypertension, On Medications negative cardio ROS Normal cardiovascular exam Rhythm:regular Rate:Normal     Neuro/Psych negative neurological ROS  negative psych ROS   GI/Hepatic negative GI ROS, Neg liver ROS,   Endo/Other  negative endocrine ROS  Renal/GU negative Renal ROS  negative genitourinary   Musculoskeletal   Abdominal   Peds  Hematology negative hematology ROS (+)   Anesthesia Other Findings Past Medical History: No date: Arthritis     Comment:  knees 01/15/96: Breast cancer ([place])     Comment:  right mastectomy No date: Dyspepsia No date: Hypercholesteremia No date: Hypertension No date: Wears contact lenses Past Surgical History: No date: ABDOMINAL HYSTERECTOMY 01/10/2016: CATARACT EXTRACTION W/PHACO; Right     Comment:  Procedure: CATARACT EXTRACTION PHACO AND INTRAOCULAR               LENS PLACEMENT ([place]);  Surgeon: Leandrew Koyanagi, MD;               Location: [facility];  Service: Ophthalmology;                Laterality: Right; No date: COLONOSCOPY No date: ESOPHAGOGASTRODUODENOSCOPY 01/15/96: MASTECTOMY; Right     Comment:  rt mastetomy No date: TUBAL LIGATION BMI    Body Mass Index:  24.02 kg/m     Reproductive/Obstetrics negative OB ROS                             Anesthesia Physical Anesthesia Plan  ASA: III  Anesthesia Plan: Spinal   Post-op Pain Management:    Induction:   PONV Risk Score and Plan:   Airway Management  Planned:   Additional Equipment:   Intra-op Plan:   Post-operative Plan:   Informed Consent: I have reviewed the patients History and Physical, chart, labs and discussed the procedure including the risks, benefits and alternatives for the proposed anesthesia with the patient or authorized representative who has indicated his/her understanding and acceptance.   Dental Advisory Given  Plan Discussed with: CRNA  Anesthesia Plan Comments:         Anesthesia Quick Evaluation

## 2017-12-08 NOTE — Transfer of Care (Signed)
Immediate Anesthesia Transfer of Care Note  Patient: Katie Sheppard  Procedure(s) Performed: COMPUTER ASSISTED TOTAL KNEE ARTHROPLASTY (Left )  Patient Location: PACU  Anesthesia Type:Spinal  Level of Consciousness: awake, alert , oriented and patient cooperative  Airway & Oxygen Therapy: Patient Spontanous Breathing and Patient connected to nasal cannula oxygen  Post-op Assessment: Report given to RN and Post -op Vital signs reviewed and stable  Post vital signs: Reviewed and stable  Last Vitals:  Vitals Value Taken Time  BP    Temp    Pulse 70 12/08/2017 10:22 AM  Resp 16 12/08/2017 10:22 AM  SpO2 99 % 12/08/2017 10:22 AM  Vitals shown include unvalidated device data.  Last Pain:  Vitals:   12/08/17 0641  TempSrc: Oral  PainSc: 0-No pain         Complications: No apparent anesthesia complications

## 2017-12-09 LAB — BASIC METABOLIC PANEL
Anion gap: 5 (ref 5–15)
BUN: 8 mg/dL (ref 8–23)
CALCIUM: 7.9 mg/dL — AB (ref 8.9–10.3)
CO2: 24 mmol/L (ref 22–32)
Chloride: 109 mmol/L (ref 98–111)
Creatinine, Ser: 0.41 mg/dL — ABNORMAL LOW (ref 0.44–1.00)
GFR calc Af Amer: >60 mL/min (ref >60)
GLUCOSE: 110 mg/dL — AB (ref 70–99)
Potassium: 3.9 mmol/L (ref 3.5–5.1)
SODIUM: 138 mmol/L (ref 135–145)

## 2017-12-09 MED ORDER — ENOXAPARIN SODIUM 40 MG/0.4ML ~~LOC~~ SOLN: 40.0000 mg | SUBCUTANEOUS | 0 refills | Status: DC

## 2017-12-09 MED ORDER — OXYCODONE HCL 5 MG PO TABS: 5.0000 mg | ORAL_TABLET | ORAL | 0 refills | Status: DC | PRN

## 2017-12-09 MED ORDER — TRAMADOL HCL 50 MG PO TABS: 50.0000 mg | ORAL_TABLET | Freq: Four times a day (QID) | ORAL | 0 refills | Status: DC | PRN

## 2017-12-09 NOTE — Progress Notes (Signed)
ORTHOPAEDICS PROGRESS NOTE  PATIENT NAME: Katie Sheppard DOB: 1946-02-07  MRN: [id number]  POD # 1: Left total knee arthroplasty  Subjective: The patient states the pain is been well controlled. She denies any nausea, vomiting, or shortness of breath. The patient tolerated her physical therapy evaluation yesterday and did well.  Objective: Vital signs in last 24 hours: Temp:  [97.5 F (36.4 C)-98.8 F (37.1 C)] 98.7 F (37.1 C) (10/08 0421) Pulse Rate:  [59-80] 67 (10/08 0421) Resp:  [15-21] 19 (10/08 0421) BP: (112-147)/(48-73) 112/53 (10/08 0421) SpO2:  [90 %-100 %] 97 % (10/08 0421)  Intake/Output from previous day: 10/07 0701 - 10/08 0700 In: 3175 [I.V.:2575; IV Piggyback:600] Out: 3000 [Urine:2750; Drains:200; Blood:50]  Recent Labs    12/09/17 0531  K 3.9  CL 109  CO2 24  BUN 8  CREATININE 0.41*  GLUCOSE 110*  CALCIUM 7.9*    EXAM General: Well-developed well-nourished female seen in no apparent discomfort. Lungs: clear to auscultation Cardiac: normal rate and regular rhythm Abdomen: Soft, nontender, nondistended.  Bowel sounds are present. Left lower extremity: Dressing is dry and intact.  Polar Care and Hemovac drains are in place and functioning.  Bone foam is in place with the knee fully extended.  The patient is able to perform an independent straight leg raise.  Homans test is negative. Neurologic: Awake, alert, and oriented.  Sensory and motor function are intact.  Assessment: Left total knee arthroplasty  Secondary diagnoses: Hypertension Hypercholesterolemia History of breast cancer  Plan: Today's goals were reviewed with the patient.  Continue physical therapy and Occupational Therapy as per total knee arthroplasty rehab protocol. Plan is to go Home after hospital stay. DVT Prophylaxis - Lovenox, Foot Pumps and TED hose  James P. Holley Bouche M.D.

## 2017-12-09 NOTE — Discharge Summary (Signed)
Physician Discharge Summary  Patient ID: Katie Sheppard MRN: [id number] DOB/AGE: 07/12/1945 72 y.o.  Admit date: 12/08/2017 Discharge date: 12/10/2017  Admission Diagnoses:  PRIMARY OSTEOARTHRITIS OF LEFT KNEE   Discharge Diagnoses: Patient Active Problem List   Diagnosis Date Noted  . S/P total knee arthroplasty 12/08/2017  . Dyspepsia 12/07/2017  . Hyperlipidemia 12/07/2017  . Hypertension 12/07/2017  . Osteopenia 12/07/2017  . Polycythemia 12/07/2017  . Primary osteoarthritis of both knees 10/10/2017  . History of breast cancer in female 03/19/2016    Past Medical History:  Diagnosis Date  . Arthritis    knees  . Breast cancer ([place]) 01/15/96   right mastectomy  . Dyspepsia   . Hypercholesteremia   . Hypertension   . Wears contact lenses      Transfusion: No transfusions during this admission   Consultants (if any):   Discharged Condition: Improved  Hospital Course: Katie Sheppard is an 72 y.o. female who was admitted 12/08/2017 with a diagnosis of degenerative arthrosis left knee and went to the operating room on 12/08/2017 and underwent the above named procedures.    Surgeries:Procedure(s): COMPUTER ASSISTED TOTAL KNEE ARTHROPLASTY on 12/08/2017  PRE-OPERATIVE DIAGNOSIS: Degenerative arthrosis of the left knee, primary  POST-OPERATIVE DIAGNOSIS:  Same  PROCEDURE:  Left total knee arthroplasty using computer-assisted navigation  SURGEON:  Marciano Sequin. M.D.  ASSISTANT:  Vance Peper, PA (present and scrubbed throughout the case, critical for assistance with exposure, retraction, instrumentation, and closure)  ANESTHESIA: spinal  ESTIMATED BLOOD LOSS: 50 mL  FLUIDS REPLACED: 850 mL of crystalloid  TOURNIQUET TIME: 79 minutes  DRAINS: 2 medium Hemovac drains  SOFT TISSUE RELEASES: Anterior cruciate ligament, posterior cruciate ligament, deep medial collateral ligament, patellofemoral ligament, posterolateral corner  IMPLANTS  UTILIZED: DePuy Attune size 3 posterior stabilized femoral component (cemented), size 3 rotating platform tibial component (cemented), 32 mm medialized dome patella (cemented), and a 7 mm stabilized rotating platform polyethylene insert.  INDICATIONS FOR SURGERY: Katie Sheppard is a 72 y.o. year old female with a long history of progressive knee pain. X-rays demonstrated severe degenerative changes in tricompartmental fashion. The patient had not seen any significant improvement despite conservative nonsurgical intervention. After discussion of the risks and benefits of surgical intervention, the patient expressed understanding of the risks benefits and agree with plans for total knee arthroplasty.   The risks, benefits, and alternatives were discussed at length including but not limited to the risks of infection, bleeding, nerve injury, stiffness, blood clots, the need for revision surgery, cardiopulmonary complications, among others, and they were willing to proceed. Patient tolerated the surgery well. No complications .Patient was taken to PACU where she was stabilized and then transferred to the orthopedic floor.  Patient started on Lovenox 30 mg q 12 hrs. Foot pumps applied bilaterally at 80 mm hgb. Heels elevated off bed with rolled towels. No evidence of DVT. Calves non tender. Negative Homan. Physical therapy started on day #1 for gait training and transfer with OT starting on  day #1 for ADL and assisted devices. Patient has done well with therapy. Ambulated greater than 200 feet upon being discharged.  Was able to ascend and descend 4 steps safely and independently  Patient's IV And Foley were discontinued on day #1 with Hemovac being discontinued on day #2. Dressing was changed on day 2 prior to patient being discharged   She was given perioperative antibiotics:  Anti-infectives (From admission, onward)   Start     Dose/Rate Route Frequency Ordered  Stop   12/08/17 1400  ceFAZolin  (ANCEF) IVPB 2g/100 mL premix     2 g 200 mL/hr over 30 Minutes Intravenous Every 6 hours 12/08/17 1228 12/09/17 1359   12/08/17 0612  ceFAZolin (ANCEF) 2-4 GM/100ML-% IVPB    Note to Pharmacy:  [place]: cabinet override      12/08/17 0612 12/08/17 0738   12/08/17 0600  ceFAZolin (ANCEF) IVPB 2g/100 mL premix     2 g 200 mL/hr over 30 Minutes Intravenous On call to O.R. 12/07/17 2150 12/08/17 0750    .  She was fitted with AV 1 compression foot pump devices, instructed on heel pumps, early ambulation, and fitted with TED stockings bilaterally for DVT prophylaxis.  She benefited maximally from the hospital stay and there were no complications.    Recent vital signs:  Vitals:   12/08/17 2336 12/09/17 0421  BP: (!) 113/48 (!) 112/53  Pulse: 67 67  Resp: 19 19  Temp: 98.2 F (36.8 C) 98.7 F (37.1 C)  SpO2: 99% 97%    Recent laboratory studies:  Lab Results  Component Value Date   HGB 14.2 11/26/2017   Lab Results  Component Value Date   WBC 12.0 (H) 11/26/2017   PLT 348 11/26/2017   Lab Results  Component Value Date   INR 0.94 11/26/2017   Lab Results  Component Value Date   NA 138 12/09/2017   K 3.9 12/09/2017   CL 109 12/09/2017   CO2 24 12/09/2017   BUN 8 12/09/2017   CREATININE 0.41 (L) 12/09/2017   GLUCOSE 110 (H) 12/09/2017    Discharge Medications:   Allergies as of 12/09/2017      Reactions   Dorzolamide Hcl-timolol Mal Swelling   eyes   Atorvastatin Other (See Comments)   Brimonidine Tartrate-timolol Swelling, Other (See Comments)   eyes Eye irritation eyes   Pravastatin Other (See Comments)      Medication List    STOP taking these medications   acetaminophen 650 MG CR tablet Commonly known as:  TYLENOL   B-12 5000 MCG Caps   BIOFREEZE EX   brinzolamide 1 % ophthalmic suspension Commonly known as:  AZOPT   dimenhyDRINATE 50 MG tablet Commonly known as:  DRAMAMINE   meloxicam 15 MG tablet Commonly known as:  MOBIC    metoprolol succinate 25 MG 24 hr tablet Commonly known as:  TOPROL-XL   Potassium 99 MG Tabs   pseudoephedrine 30 MG tablet Commonly known as:  SUDAFED   ROCKLATAN 0.02-0.005 % Soln Generic drug:  Netarsudil-Latanoprost   rosuvastatin 20 MG tablet Commonly known as:  CRESTOR   SUPER B COMPLEX/C PO   TURMERIC PO     TAKE these medications   enoxaparin 40 MG/0.4ML injection Commonly known as:  LOVENOX Inject 0.4 mLs (40 mg total) into the skin daily for 14 days. Start taking on:  12/11/2017   oxyCODONE 5 MG immediate release tablet Commonly known as:  Oxy IR/ROXICODONE Take 1 tablet (5 mg total) by mouth every 4 (four) hours as needed for moderate pain (pain score 4-6).   traMADol 50 MG tablet Commonly known as:  ULTRAM Take 1-2 tablets (50-100 mg total) by mouth every 6 (six) hours as needed for moderate pain. What changed:    how much to take  when to take this  reasons to take this            Durable Medical Equipment  (From admission, onward)  Start     Ordered   12/08/17 1229  DME Walker rolling  Once    Question:  Patient needs a walker to treat with the following condition  Answer:  Total knee replacement status   12/08/17 1228   12/08/17 1229  DME Bedside commode  Once    Question:  Patient needs a bedside commode to treat with the following condition  Answer:  Total knee replacement status   12/08/17 1228          Diagnostic Studies: Dg Knee Left Port  Result Date: 12/08/2017 CLINICAL DATA:  Postop left total knee replacement EXAM: PORTABLE LEFT KNEE - 1-2 VIEW COMPARISON:  None. FINDINGS: Changes of left knee replacement. Soft tissue drains in place. No hardware or bony complicating feature. IMPRESSION: Left knee replacement.  No visible complicating feature. Electronically Signed   By: Rolm Baptise M.D.   On: 12/08/2017 11:10    Disposition:   Discharge Instructions    Diet - low sodium heart healthy   Complete by:  As directed     Increase activity slowly   Complete by:  As directed       Follow-up Information    Watt Climes, PA On 12/23/2017.   Specialty:  Physician Assistant Why:  at 10:15am Contact information: [place] [address] [phone]        Dereck Leep, MD On 01/20/2018.   Specialty:  Orthopedic Surgery Why:  at 10:00am Contact information: [place] [address] [phone]            Signed: Watt Climes 12/09/2017, 7:32 AM

## 2017-12-09 NOTE — Evaluation (Signed)
Occupational Therapy Evaluation Patient Details Name: Katie Sheppard MRN: [id number] DOB: 29-Aug-1945 Today's Date: 12/09/2017    History of Present Illness Pt is s/p L TKA on 12/08/2017. Pt's PMH includes Dyspepsia, HLD, HTN, osteopenia, Polycythemia, primary OA bilateral knees, and breast CA s/p R mastectomy.   Clinical Impression   Pt seen for OT evaluation this date. Pt is a 72 y/o female presenting a Left TKA.  Prior to admission, pt enjoyed jigsaw puzzles and completing crosswords. She lives with her spouse in a 2 story home but is able to live on the main floor with bedroom and BSC. She has support of spouse during recovery.  Currently, pt demonstrates impairments in pain, strength and ROM requiring MIN assist for LB dressing and bathing and functional mobility. Pt was educated in polar care and compression sock management, including donning/doffing, positioning, and wear schedule. Pt educated on AE and DME to increase safety and independence with ADL. Pt was agreeable and verbalized understanding of the education that was provided this date. No additional skilled OT needs at this time. Upon discharge, no further skilled OT services are recommended.      Follow Up Recommendations  No OT follow up    Equipment Recommendations  Other (comment)(reacher)    Recommendations for Other Services       Precautions / Restrictions Precautions Precautions: Fall Precaution Comments: WBAT Required Braces or Orthoses: Knee Immobilizer - Left Knee Immobilizer - Left: Discontinue once straight leg raise with < 10 degree lag Restrictions Weight Bearing Restrictions: Yes LLE Weight Bearing: Weight bearing as tolerated      Mobility Bed Mobility Overal bed mobility: Supervision             General bed mobility comments:   Transfers         General transfer comment: Pt presented in bed for OT session. Mobility deferred secondary to PT arrival to further assess. Per Nurse tech  in room at start of session, pt had just transferred EOB <>BSC with S to CGA without difficulty.    Balance Overall balance assessment: Mild deficits observed, not formally tested Sitting-balance support: No upper extremity supported;Feet supported                                       ADL either performed or assessed with clinical judgement   ADL Overall ADL's : Needs assistance/impaired Eating/Feeding: Independent   Grooming: Independent   Upper Body Bathing: Modified independent;Sitting   Lower Body Bathing: Sit to/from stand;Minimal assistance   Upper Body Dressing : Modified independent;Sitting   Lower Body Dressing: Minimal assistance;Sit to/from stand   Toilet Transfer: Supervision/safety;BSC                  Vision Baseline Vision/History: Cataracts Patient Visual Report: No change from baseline       Perception     Praxis      Pertinent Vitals/Pain Pain Assessment: 0-10 Pain Score: 5 Pain Location: L knee Pain Descriptors / Indicators: Discomfort Pain Intervention(s): Limited activity within patient's tolerance;Monitored during session;Premedicated before session;Repositioned     Hand Dominance Right   Extremity/Trunk Assessment Upper Extremity Assessment Upper Extremity Assessment: Overall WFL for tasks assessed   Lower Extremity Assessment Lower Extremity Assessment: Defer to PT evaluation       Communication Communication Communication: No difficulties   Cognition Arousal/Alertness: Awake/alert Behavior During Therapy: WFL for tasks assessed/performed Overall Cognitive  Status: Within Functional Limits for tasks assessed                                     General Comments       Exercises Other Exercises Other Exercises: Pt instructed in polar management care including donning/doffing and wear schedule. Other Exercises: Pt instructed in compression management in including donning/doffing, wear  schedule, and positioning. Other Exercises: Pt educated in use of DME and AE for increased safety and independence. Other Exercises: Pt educated in and verbalized understanding of home environment modifications to increase safety and independence.    Shoulder Instructions      Home Living Family/patient expects to be discharged to:: Private residence Living Arrangements: Spouse/significant other Available Help at Discharge: Family;Available PRN/intermittently Type of Home: House Home Access: Stairs to enter [employer] of Steps: 3 Entrance Stairs-Rails: None Home Layout: Able to live on main level with bedroom/bathroom     Bathroom Shower/Tub: Occupational psychologist: Standard     Home Equipment: Environmental consultant - 2 wheels;Cane - single point;Grab bars - tub/shower;Bedside commode;Wheelchair - manual          Prior Functioning/Environment Level of Independence: Independent with assistive device(s)        Comments: Per pt independent with all ADL, IADL, and ambulates with SPC.        OT Problem List:        OT Treatment/Interventions:      OT Goals(Current goals can be found in the care plan section) Acute Rehab OT Goals Patient Stated Goal: To be independent again and go home. OT Goal Formulation: All assessment and education complete, DC therapy ADL Goals Pt Will Perform Lower Body Dressing: with modified independence;sit to/from stand(Pt will complete seated LB dressing using LRAD with modified independence.) Additional ADL Goal #1: Pt will demonstrate at least 2 falls prevention strategies while completing ADL and IADL tasks. Additional ADL Goal #2: Pt will instruct family with compression sock mangement including wear schedule, donning and doffing, and wear schedule.  OT Frequency:     Barriers to D/C:            Co-evaluation              AM-PAC PT "6 Clicks" Daily Activity     Outcome Measure Help from another person eating meals?:  None Help from another person taking care of personal grooming?: None Help from another person toileting, which includes using toliet, bedpan, or urinal?: A Little Help from another person bathing (including washing, rinsing, drying)?: A Little Help from another person to put on and taking off regular upper body clothing?: None Help from another person to put on and taking off regular lower body clothing?: A Little 6 Click Score: 21   End of Session    Activity Tolerance: Patient tolerated treatment well Patient left: in bed;with bed alarm set;with call bell/phone within reach;with SCD's reapplied  OT Visit Diagnosis: Other abnormalities of gait and mobility (R26.89);Pain Pain - Right/Left: Left Pain - part of body: Knee                Time: 9735-3299 OT Time Calculation (min): 25 min Charges:     Jadene Pierini OTS  12/09/2017, 10:35 AM

## 2017-12-09 NOTE — Progress Notes (Signed)
Physical Therapy Treatment Patient Details Name: Katie Sheppard MRN: [id number] DOB: 1945-09-19 Today's Date: 12/09/2017    History of Present Illness Pt is s/p L TKA on 12/08/2017. Pt's PMH includes Dyspepsia, HLD, HTN, osteopenia, Polycythemia, primary OA bilateral knees, and breast CA s/p R mastectomy.    PT Comments    Pt able to progress to ambulating 60 feet with RW CGA.  Pain 4/10 L knee beginning and end of session.  CGA to min assist to stand with RW.  Able to perform L LE SLR x10 reps independently so no KI indicated.  Will continue to progress pt with strengthening, knee ROM, and progressive functional mobility per pt tolerance.    Follow Up Recommendations  Home health PT     Equipment Recommendations  Rolling walker with 5" wheels    Recommendations for Other Services OT consult     Precautions / Restrictions Precautions Precautions: Fall Precaution Comments: WBAT Required Braces or Orthoses: Knee Immobilizer - Left Knee Immobilizer - Left: Discontinue once straight leg raise with < 10 degree lag Restrictions Weight Bearing Restrictions: Yes LLE Weight Bearing: Weight bearing as tolerated    Mobility  Bed Mobility Overal bed mobility: Modified Independent             General bed mobility comments: Semi-supine to sit with mild increased effort.  Transfers Overall transfer level: Needs assistance Equipment used: Rolling walker (2 wheeled) Transfers: Sit to/from Stand Sit to Stand: Min assist;Min guard         General transfer comment: min assist to initiate stand up to RW from bed; CGA from [facility]; occasional vc's for UE/LE placement  Ambulation/Gait Ambulation/Gait assistance: Min guard Gait Distance (Feet): 60 Feet Assistive device: Rolling walker (2 wheeled)   Gait velocity: decreased   General Gait Details: partial step through gait pattern; vc's to increase UE support through RW to offweight L LE   Stairs              Wheelchair Mobility    Modified Rankin (Stroke Patients Only)       Balance Overall balance assessment: Needs assistance Sitting-balance support: No upper extremity supported;Feet supported   Sitting balance - Comments: steady sitting reaching outside BOS   Standing balance support: Single extremity supported Standing balance-Leahy Scale: Poor Standing balance comment: requires at least single UE support for static standing balance                            Cognition Arousal/Alertness: Awake/alert Behavior During Therapy: WFL for tasks assessed/performed Overall Cognitive Status: Within Functional Limits for tasks assessed                                        Exercises Total Joint Exercises Straight Leg Raises: AROM;Strengthening;Left;10 reps;Supine Goniometric ROM: L knee extension 2 degrees short of neutral; L knee flexion 80 degrees sitting edge of recliner    General Comments General comments (skin integrity, edema, etc.): L knee dressings and hemovac in place.  Nursing cleared pt for participation in physical therapy.  Pt agreeable to PT session but requesting to toilet.      Pertinent Vitals/Pain Pain Assessment: 0-10 Pain Score: 4  Pain Location: L knee Pain Descriptors / Indicators: Sore Pain Intervention(s): Limited activity within patient's tolerance;Monitored during session;Premedicated before session;Repositioned;Other (comment)(polar care applied and activated)    Home Living  Prior Function        PT Goals (current goals can now be found in the care plan section) Acute Rehab PT Goals Patient Stated Goal: to go home and be independent PT Goal Formulation: With patient Potential to Achieve Goals: Good Progress towards PT goals: Progressing toward goals    Frequency    BID      PT Plan Current plan remains appropriate    Co-evaluation              AM-PAC PT "6 Clicks" Daily Activity  Outcome  Measure  Difficulty turning over in bed (including adjusting bedclothes, sheets and blankets)?: None Difficulty moving from lying on back to sitting on the side of the bed? : A Little Difficulty sitting down on and standing up from a chair with arms (e.g., wheelchair, bedside commode, etc,.)?: Unable Help needed moving to and from a bed to chair (including a wheelchair)?: A Little Help needed walking in hospital room?: A Little Help needed climbing 3-5 steps with a railing? : A Little 6 Click Score: 17    End of Session Equipment Utilized During Treatment: Gait belt Activity Tolerance: Patient tolerated treatment well;No increased pain Patient left: in chair;with call bell/phone within reach;with chair alarm set;with family/visitor present;with SCD's reapplied;Other (comment)(B heels elevated via towel rolls; polar care in place and activated) Nurse Communication: Mobility status;Precautions;Weight bearing status PT Visit Diagnosis: Muscle weakness (generalized) (M62.81);Pain;Other abnormalities of gait and mobility (R26.89);Difficulty in walking, not elsewhere classified (R26.2) Pain - Right/Left: Left Pain - part of body: Knee     Time: 0935-1005 PT Time Calculation (min) (ACUTE ONLY): 30 min  Charges:  $Therapeutic Exercise: 8-22 mins $Therapeutic Activity: 8-22 mins                    Leitha Bleak, PT 12/09/17, 11:31 AM [phone]

## 2017-12-09 NOTE — Progress Notes (Signed)
Clinical Social Worker (CSW) received SNF consult. PT is recommending home health. RN case manager aware of above. Please reconsult if future social work needs arise. CSW signing off.   Madyson Lukach, LCSW [phone] 

## 2017-12-09 NOTE — Care Management Note (Signed)
Case Management Note  Patient Details  Name: Bellah Alia MRN: [id number] Date of Birth: November 26, 1945  Subjective/Objective:                Patient had total knee replacement 10.7.  Anticipated discharge 10.9. Has walker and bedside commode. Lovenox copay will be 27.27 at [facility].  Script was e scribed. [facility] referral was initiated prior to procedure.   Action/Plan:  Notified [facility] anticipate discharge 10.9. Spoke with primary nurse regarding need for instruction for Lovenox injections  Expected Discharge Date:  12/10/17               Expected Discharge Plan:     In-House Referral:     Discharge planning Services     Post Acute Care Choice:    Choice offered to:     DME Arranged:    DME Agency:     HH Arranged:    HH Agency:     Status of Service:     If discussed at [employer], dates discussed:    Additional Comments:  Katrina Stack, RN 12/09/2017, 3:34 PM

## 2017-12-09 NOTE — Progress Notes (Signed)
Physical Therapy Treatment Patient Details Name: Katie Sheppard MRN: [id number] DOB: 04-03-1945 Today's Date: 12/09/2017    History of Present Illness Pt is s/p L TKA on 12/08/2017. Pt's PMH includes Dyspepsia, HLD, HTN, osteopenia, Polycythemia, primary OA bilateral knees, and breast CA s/p R mastectomy.    PT Comments    Pt able to progress to ambulating 120 feet with youth sized RW CGA.  Tolerated seated LE ex's well.  Steady with functional mobility using youth sized RW.  Pain 4/10 L knee beginning of session and 5/10 end of session.  Will continue to progress pt with strengthening, knee ROM, increasing ambulation distance, and trial stairs as appropriate next session.    Follow Up Recommendations  Home health PT     Equipment Recommendations  Rolling walker with 5" wheels(youth sized)    Recommendations for Other Services OT consult     Precautions / Restrictions Precautions Precautions: Fall Precaution Comments: WBAT Required Braces or Orthoses: Knee Immobilizer - Left Knee Immobilizer - Left: Discontinue once straight leg raise with < 10 degree lag Restrictions Weight Bearing Restrictions: Yes LLE Weight Bearing: Weight bearing as tolerated    Mobility  Bed Mobility Overal bed mobility: Modified Independent             General bed mobility comments: Sit to supine with mild increased effort.  Transfers Overall transfer level: Needs assistance Equipment used: Rolling walker (2 wheeled) Transfers: Sit to/from Stand Sit to Stand: Min guard         General transfer comment: mild increased effort to stand with RW (from chair and BSC over toilet)  Ambulation/Gait Ambulation/Gait assistance: Min guard Gait Distance (Feet): 120 Feet Assistive device: (youth sized RW)   Gait velocity: decreased   General Gait Details: partial step through gait pattern; vc's to increase UE support through RW to offweight L LE   Stairs             Wheelchair  Mobility    Modified Rankin (Stroke Patients Only)       Balance Overall balance assessment: Needs assistance Sitting-balance support: No upper extremity supported;Feet supported Sitting balance-Leahy Scale: Normal Sitting balance - Comments: steady sitting reaching outside BOS   Standing balance support: Single extremity supported Standing balance-Leahy Scale: Poor Standing balance comment: requires at least single UE support for static standing balance                            Cognition Arousal/Alertness: Awake/alert Behavior During Therapy: WFL for tasks assessed/performed Overall Cognitive Status: Within Functional Limits for tasks assessed                                        Exercises Total Joint Exercises Long Arc Quad: AROM;Strengthening;Left;10 reps;Seated Knee Flexion: AROM;Left;Seated(3 reps with 15 second holds each) General Exercises - Lower Extremity Hip Flexion/Marching: AROM;Strengthening;Left;10 reps;Seated    General Comments General comments (skin integrity, edema, etc.): L knee dressings and hemovac in place.  Pt agreeable to PT session.      Pertinent Vitals/Pain Pain Assessment: 0-10 Pain Score: 5  Pain Location: L knee Pain Descriptors / Indicators: Sore Pain Intervention(s): Limited activity within patient's tolerance;Monitored during session;Premedicated before session;Repositioned;Other (comment)(Polar care applied and activated)  Vitals (HR and O2 on room air) stable and WFL throughout treatment session.    Home Living  Prior Function            PT Goals (current goals can now be found in the care plan section) Acute Rehab PT Goals Patient Stated Goal: to go home and be independent PT Goal Formulation: With patient Potential to Achieve Goals: Good Progress towards PT goals: Progressing toward goals    Frequency    BID      PT Plan Current plan remains appropriate     Co-evaluation              AM-PAC PT "6 Clicks" Daily Activity  Outcome Measure  Difficulty turning over in bed (including adjusting bedclothes, sheets and blankets)?: None Difficulty moving from lying on back to sitting on the side of the bed? : A Little Difficulty sitting down on and standing up from a chair with arms (e.g., wheelchair, bedside commode, etc,.)?: Unable Help needed moving to and from a bed to chair (including a wheelchair)?: A Little Help needed walking in hospital room?: A Little Help needed climbing 3-5 steps with a railing? : A Little 6 Click Score: 17    End of Session Equipment Utilized During Treatment: Gait belt Activity Tolerance: Patient tolerated treatment well Patient left: in bed;with call bell/phone within reach;with bed alarm set;with SCD's reapplied;Other (comment)(L LE elevated via bone foam; R heel elevated via towel roll; polar care in place and activated) Nurse Communication: Mobility status;Precautions;Weight bearing status PT Visit Diagnosis: Muscle weakness (generalized) (M62.81);Pain;Other abnormalities of gait and mobility (R26.89);Difficulty in walking, not elsewhere classified (R26.2) Pain - Right/Left: Left Pain - part of body: Knee     Time: 3244-0102 PT Time Calculation (min) (ACUTE ONLY): 33 min  Charges:  $Gait Training: 8-22 mins $Therapeutic Exercise: 8-22 mins                     Leitha Bleak, PT 12/09/17, 3:04 PM [phone]

## 2017-12-10 NOTE — Progress Notes (Addendum)
Physical Therapy Treatment Patient Details Name: Katie Sheppard MRN: [id number] DOB: [birth year] Today's Date: 12/10/2017    History of Present Illness Pt is s/p L TKA on 12/08/2017. Pt's PMH includes Dyspepsia, HLD, HTN, osteopenia, Polycythemia, primary OA bilateral knees, and breast CA s/p R mastectomy.    PT Comments    Pt progressing well in all areas of rehab. Pt demonstrating continued success with 10 SLR independently and adequate quadriceps strength. Ambulation progressed to 200 feet with pt able to improve step through gait with VC and reduce UE reliance to promote increased WBing of L LE. Pt negotiating stairs well with only CGA for safety and initial instruction. Pt utilizing backwards approach with RW safely, no LOB, no tipping of RW, and no physical assist required. Pt remains most appropriate for HHPT to continue progressing functional improvements following s/p TKR.    Follow Up Recommendations  Home health PT     Equipment Recommendations  Rolling walker with 5" wheels    Recommendations for Other Services       Precautions / Restrictions Precautions Precautions: Fall Precaution Comments: WBAT Restrictions Weight Bearing Restrictions: Yes LLE Weight Bearing: Weight bearing as tolerated    Mobility  Bed Mobility Overal bed mobility: Modified Independent Bed Mobility: Supine to Sit;Sit to Supine     Supine to sit: Modified independent (Device/Increase time) Sit to supine: Modified independent (Device/Increase time)   General bed mobility comments: Minimal effort noted with bed mobility only increased time required otherwise independent without assistance  Transfers Overall transfer level: Needs assistance Equipment used: Rolling walker (2 wheeled) Transfers: Sit to/from Stand Sit to Stand: Supervision         General transfer comment: Minimal effort noted during sit to stand, no VC needed, no physical assist needed pt with excellent  effort  Ambulation/Gait Ambulation/Gait assistance: Min guard Gait Distance (Feet): 200 Feet Assistive device: Rolling walker (2 wheeled)       General Gait Details: Pt with good effort no increase in pain during ambulation, improved step through gait noted following VC, pt with decreased UE reliance to promote increased WBing through L LE   Stairs Stairs: Yes Stairs assistance: Min guard Stair Management: No rails;With walker Number of Stairs: 4 General stair comments: Pt with great effort able to demonstrate proper negotiation of stairs with use of RW and backwards technique. Good stability with RW, CGA for safety, no LOB and no tipping of RW noted.   Wheelchair Mobility    Modified Rankin (Stroke Patients Only)       Balance Overall balance assessment: No apparent balance deficits (not formally assessed)   Sitting balance-Leahy Scale: Normal       Standing balance-Leahy Scale: Fair Standing balance comment: demonstrates good static balance without use of UE                            Cognition Arousal/Alertness: Awake/alert Behavior During Therapy: WFL for tasks assessed/performed Overall Cognitive Status: Within Functional Limits for tasks assessed                                        Exercises Total Joint Exercises Ankle Circles/Pumps: AROM;Both;10 reps;Supine Quad Sets: AROM;Strengthening;Both;10 reps;Supine Heel Slides: AROM;Both;10 reps;Supine Hip ABduction/ADduction: AROM;Both;10 reps;Supine Straight Leg Raises: AROM;Strengthening;Both;10 reps;Supine Goniometric ROM: L knee ROM 1-93 supine    General Comments General comments (skin  integrity, edema, etc.): hemovac no longer in use      Pertinent Vitals/Pain Pain Assessment: 0-10 Pain Score: 5  Pain Location: L knee Pain Descriptors / Indicators: Sore Pain Intervention(s): Limited activity within patient's tolerance;Monitored during session    Home Living                       Prior Function            PT Goals (current goals can now be found in the care plan section) Progress towards PT goals: Progressing toward goals    Frequency    BID      PT Plan Current plan remains appropriate    Co-evaluation              AM-PAC PT "6 Clicks" Daily Activity  Outcome Measure  Difficulty turning over in bed (including adjusting bedclothes, sheets and blankets)?: None Difficulty moving from lying on back to sitting on the side of the bed? : A Little Difficulty sitting down on and standing up from a chair with arms (e.g., wheelchair, bedside commode, etc,.)?: A Little Help needed moving to and from a bed to chair (including a wheelchair)?: A Little Help needed walking in hospital room?: A Little Help needed climbing 3-5 steps with a railing? : A Little 6 Click Score: 19    End of Session Equipment Utilized During Treatment: Gait belt Activity Tolerance: Patient tolerated treatment well Patient left: in bed;with call bell/phone within reach;with bed alarm set;Other (comment)(with L LE in bone foam and R LE ankle elevated with towel roll ) Nurse Communication: Mobility status PT Visit Diagnosis: Muscle weakness (generalized) (M62.81);Pain;Other abnormalities of gait and mobility (R26.89);Difficulty in walking, not elsewhere classified (R26.2) Pain - Right/Left: Left Pain - part of body: Knee     Time: 7121-9758 PT Time Calculation (min) (ACUTE ONLY): 29 min  Charges:                        Ernie Avena, SPT 12/10/2017, 10:50 AM

## 2017-12-10 NOTE — Anesthesia Postprocedure Evaluation (Signed)
Anesthesia Post Note  Patient: Rubena Roseman  Procedure(s) Performed: COMPUTER ASSISTED TOTAL KNEE ARTHROPLASTY (Left )  Patient location during evaluation: PACU Anesthesia Type: General Level of consciousness: awake and alert Pain management: pain level controlled Vital Signs Assessment: post-procedure vital signs reviewed and stable Respiratory status: spontaneous breathing, nonlabored ventilation, respiratory function stable and patient connected to nasal cannula oxygen Cardiovascular status: blood pressure returned to baseline and stable Postop Assessment: no apparent nausea or vomiting Anesthetic complications: no     Last Vitals:  Vitals:   12/09/17 2306 12/10/17 0758  BP: 129/62 134/65  Pulse: 79 62  Resp: 20   Temp: 36.9 C 36.7 C  SpO2: 98% 100%    Last Pain:  Vitals:   12/10/17 1022  TempSrc:   PainSc: 4                  Molli Barrows

## 2017-12-10 NOTE — Progress Notes (Signed)
Discharge instructions and prescriptions given to pt. IV removed. Honeycomb dressing changed and sent two extra home with pt. Pt getting dressed and awaiting ride to be discharged home.

## 2017-12-10 NOTE — Progress Notes (Addendum)
   Subjective: 2 Days Post-Op Procedure(s) (LRB): COMPUTER ASSISTED TOTAL KNEE ARTHROPLASTY (Left) Patient reports pain as mild.   Patient is well, and has had no acute complaints or problems Did well with physical therapy yesterday.  Was able to ambulate 120 feet.  No recorded range of motion noted in chart. Plan is to go Home after hospital stay. no nausea and no vomiting Patient denies any chest pains or shortness of breath. Objective: Vital signs in last 24 hours: Temp:  [97.5 F (36.4 C)-98.5 F (36.9 C)] 98.5 F (36.9 C) (10/08 2306) Pulse Rate:  [67-79] 79 (10/08 2306) Resp:  [18-20] 20 (10/08 2306) BP: (113-140)/(52-73) 129/62 (10/08 2306) SpO2:  [98 %-100 %] 98 % (10/08 2306) well approximated incision Heels are non tender and elevated off the bed using rolled towels Intake/Output from previous day: 10/08 0701 - 10/09 0700 In: 1915 [P.O.:960; I.V.:765; IV Piggyback:190] Out: 410 [Urine:300; Drains:110] Intake/Output this shift: No intake/output data recorded.  No results for input(s): HGB in the last 72 hours. No results for input(s): WBC, RBC, HCT, PLT in the last 72 hours. Recent Labs    12/09/17 0531  NA 138  K 3.9  CL 109  CO2 24  BUN 8  CREATININE 0.41*  GLUCOSE 110*  CALCIUM 7.9*   No results for input(s): LABPT, INR in the last 72 hours.  EXAM General - Patient is Alert, Appropriate and Oriented Extremity - Neurologically intact Neurovascular intact Sensation intact distally Intact pulses distally Dorsiflexion/Plantar flexion intact No cellulitis present Compartment soft Dressing - scant drainage Motor Function - intact, moving foot and toes well on exam.    Past Medical History:  Diagnosis Date  . Arthritis    knees  . Breast cancer ([place]) 01/15/96   right mastectomy  . Dyspepsia   . Hypercholesteremia   . Hypertension   . Wears contact lenses     Assessment/Plan: 2 Days Post-Op Procedure(s) (LRB): COMPUTER ASSISTED TOTAL KNEE  ARTHROPLASTY (Left) Active Problems:   S/P total knee arthroplasty  Estimated body mass index is 24.02 kg/m as calculated from the following:   Height as of this encounter: 5' (1.524 m).   Weight as of this encounter: 55.8 kg. Up with therapy Discharge home with home health  Labs: None DVT Prophylaxis - Lovenox, Foot Pumps and TED hose Weight-Bearing as tolerated to left leg Please wash operative leg, change dressing and apply TED stockings to the left leg Patient may be discharged home once she has a bowel movement, does the lap around the nurses desk and does steps. Please give the patient 2 extra honeycomb dressings to take home Hemovac was discontinued today.  Ends of the drain appeared to be intact  Akela Pocius R. [place] 12/10/2017, 7:14 AM

## 2017-12-15 ENCOUNTER — Telehealth: Payer: Self-pay

## 2017-12-15 NOTE — Telephone Encounter (Signed)
Flagged on EMMI report for having other questions or problems.  Called and spoke with patient, who reports not having any questions or concerns at this time.  Relayed feedback that call had hard time picking up her answers.  States she is doing okay, still having some pain and that PT is working with her.  She mentioned she received the second EMMI call today already.  Encouraged her to call her doctor if things did not improve after a while.  No other questions.  I thanked her for her time.

## 2018-03-30 ENCOUNTER — Other Ambulatory Visit: Payer: Self-pay | Admitting: Internal Medicine

## 2018-03-30 DIAGNOSIS — Z1231 Encounter for screening mammogram for malignant neoplasm of breast: Secondary | ICD-10-CM

## 2018-04-06 NOTE — Discharge Instructions (Signed)
°  Instructions after Total Knee Replacement ° ° Hanaa Payes P. Braydon Kullman, Jr., M.D.    ° Dept. of Orthopaedics & Sports Medicine ° [facility] ° [address] ° [place], [place]  [address] ° Phone: [phone]   Fax: [phone] ° °  °DIET: °• Drink plenty of non-alcoholic fluids. °• Resume your normal diet. Include foods high in fiber. ° °ACTIVITY:  °• You may use crutches or a walker with weight-bearing as tolerated, unless instructed otherwise. °• You may be weaned off of the walker or crutches by your Physical Therapist.  °• Do NOT place pillows under the knee. Anything placed under the knee could limit your ability to straighten the knee.   °• Continue doing gentle exercises. Exercising will reduce the pain and swelling, increase motion, and prevent muscle weakness.   °• Please continue to use the TED compression stockings for 6 weeks. You may remove the stockings at night, but should reapply them in the morning. °• Do not drive or operate any equipment until instructed. ° °WOUND CARE:  °• Continue to use the PolarCare or ice packs periodically to reduce pain and swelling. °• You may bathe or shower after the staples are removed at the first office visit following surgery. ° °MEDICATIONS: °• You may resume your regular medications. °• Please take the pain medication as prescribed on the medication. °• Do not take pain medication on an empty stomach. °• You have been given a prescription for a blood thinner (Lovenox or Coumadin). Please take the medication as instructed. (NOTE: After completing a 2 week course of Lovenox, take one Enteric-coated aspirin once a day. This along with elevation will help reduce the possibility of phlebitis in your operated leg.) °• Do not drive or drink alcoholic beverages when taking pain medications. ° °CALL THE OFFICE FOR: °• Temperature above 101 degrees °• Excessive bleeding or drainage on the dressing. °• Excessive swelling, coldness, or paleness of the toes. °• Persistent  nausea and vomiting. ° °FOLLOW-UP:  °• You should have an appointment to return to the office in 10-14 days after surgery. °• Arrangements have been made for continuation of Physical Therapy (either home therapy or outpatient therapy). °  °

## 2018-04-09 ENCOUNTER — Encounter
Admission: RE | Admit: 2018-04-09 | Discharge: 2018-04-09 | Disposition: A | Discharge status: Home / Self Care | Payer: Medicare Other | Source: Ambulatory Visit | Attending: Orthopedic Surgery | Admitting: Orthopedic Surgery

## 2018-04-09 ENCOUNTER — Other Ambulatory Visit: Payer: Self-pay

## 2018-04-09 DIAGNOSIS — Z01812 Encounter for preprocedural laboratory examination: Secondary | ICD-10-CM | POA: Diagnosis present

## 2018-04-09 LAB — URINALYSIS, ROUTINE W REFLEX MICROSCOPIC
Bilirubin Urine: NEGATIVE
Glucose, UA: NEGATIVE mg/dL
Hgb urine dipstick: NEGATIVE
Ketones, ur: NEGATIVE mg/dL
Nitrite: NEGATIVE
Protein, ur: NEGATIVE mg/dL
Specific Gravity, Urine: 1.004 — ABNORMAL LOW (ref 1.005–1.030)
pH: 6 (ref 5.0–8.0)

## 2018-04-09 LAB — COMPREHENSIVE METABOLIC PANEL
ALT: 14 U/L (ref 0–44)
AST: 18 U/L (ref 15–41)
Albumin: 4.1 g/dL (ref 3.5–5.0)
Alkaline Phosphatase: 44 U/L (ref 38–126)
Anion gap: 8 (ref 5–15)
BUN: 10 mg/dL (ref 8–23)
CHLORIDE: 98 mmol/L (ref 98–111)
CO2: 26 mmol/L (ref 22–32)
CREATININE: 0.58 mg/dL (ref 0.44–1.00)
Calcium: 8.9 mg/dL (ref 8.9–10.3)
GFR calc Af Amer: >60 mL/min (ref >60)
Glucose, Bld: 100 mg/dL — ABNORMAL HIGH (ref 70–99)
Potassium: 3.8 mmol/L (ref 3.5–5.1)
Sodium: 132 mmol/L — ABNORMAL LOW (ref 135–145)
Total Bilirubin: 0.5 mg/dL (ref 0.3–1.2)
Total Protein: 7.4 g/dL (ref 6.5–8.1)

## 2018-04-09 LAB — TYPE AND SCREEN
ABO/RH(D): A POS
Antibody Screen: NEGATIVE

## 2018-04-09 LAB — APTT: aPTT: 34 seconds (ref 24–36)

## 2018-04-09 LAB — SURGICAL PCR SCREEN
MRSA, PCR: NEGATIVE
STAPHYLOCOCCUS AUREUS: NEGATIVE

## 2018-04-09 LAB — SEDIMENTATION RATE: Sed Rate: 6 mm/hr (ref 0–30)

## 2018-04-09 LAB — PROTIME-INR
INR: 0.9
Prothrombin Time: 12.1 seconds (ref 11.4–15.2)

## 2018-04-09 LAB — CBC
HCT: 42.6 % (ref 36.0–46.0)
Hemoglobin: 13.7 g/dL (ref 12.0–15.0)
MCH: 30.8 pg (ref 26.0–34.0)
MCHC: 32.2 g/dL (ref 30.0–36.0)
MCV: 95.7 fL (ref 80.0–100.0)
Platelets: 314 10*3/uL (ref 150–400)
RBC: 4.45 MIL/uL (ref 3.87–5.11)
RDW: 11.9 % (ref 11.5–15.5)
WBC: 7.3 10*3/uL (ref 4.0–10.5)
nRBC: 0 % (ref 0.0–0.2)

## 2018-04-09 LAB — C-REACTIVE PROTEIN: CRP: <0.8 mg/dL (ref <1.0)

## 2018-04-09 NOTE — Patient Instructions (Signed)
Your procedure is scheduled on: April 13, 2018 [place] Report to Day Surgery on the 2nd floor of the [facility]. To find out your arrival time, please call [phone] between 1PM - 3PM on: Friday April 10, 2018  REMEMBER: Instructions that are not followed completely may result in serious medical risk, up to and including death; or upon the discretion of your surgeon and anesthesiologist your surgery may need to be rescheduled.  Do not eat food after midnight the night before surgery.  No gum chewing, lozengers or hard candies.  You may however, drink CLEAR liquids up to 2 hours before you are scheduled to arrive for your surgery. Do not drink anything within 2 hours of the start of your surgery.  Clear liquids include: - water  - apple juice without pulp - CLEAR gatorade - black coffee or tea (Do NOT add milk or creamers to the coffee or tea) Do NOT drink anything that is not on this list.  Type 1 and Type 2 diabetics should only drink water.  No Alcohol for 24 hours before or after surgery.  No Smoking including e-cigarettes for 24 hours prior to surgery.  No chewable tobacco products for at least 6 hours prior to surgery.  No nicotine patches on the day of surgery.  On the morning of surgery brush your teeth with toothpaste and water, you may rinse your mouth with mouthwash if you wish. Do not swallow any toothpaste or mouthwash.  Notify your doctor if there is any change in your medical condition (cold, fever, infection).  Do not wear jewelry, make-up, hairpins, clips or nail polish.  Do not wear lotions, powders, or perfumes.   Do not shave 48 hours prior to surgery.   Contacts and dentures may not be worn into surgery.  Do not bring valuables to the hospital, including drivers license, insurance or credit cards.  [facility] is not responsible for any belongings or valuables.   TAKE THESE MEDICATIONS THE MORNING OF SURGERY: TRAMADOL IF NEEDED  Use CHG  Soap  as directed on instruction sheet.  Follow recommendations from Cardiologist, Pulmonologist or PCP regarding stopping Aspirin, Coumadin, Plavix, Eliquis, Pradaxa, or Pletal.  Stop Anti-inflammatories (NSAIDS) such as MELOXICAM Advil, Aleve, Ibuprofen, Motrin, Naproxen, Naprosyn and Aspirin based products such as Excedrin, Goodys Powder, BC Powder. (May take Tylenol or Acetaminophen if needed.)  Stop ANY OVER THE COUNTER supplements until after surgery. (May continue Vitamin D, Vitamin B, POTASSIUM and multivitamin.)  Wear comfortable clothing (specific to your surgery type) to the hospital.  Plan for stool softeners for home use.  If you are being admitted to the hospital overnight, leave your suitcase in the car. After surgery it may be brought to your room.  If you are being discharged the day of surgery, you will not be allowed to drive home. You will need a responsible adult to drive you home and stay with you that night.   If you are taking public transportation, you will need to have a responsible adult with you. Please confirm with your physician that it is acceptable to use public transportation.   Please call [phone] if you have any questions about these instructions.

## 2018-04-09 NOTE — Pre-Procedure Instructions (Signed)
Abnormal urinalysis faxed to Dr Clydell Hakim office

## 2018-04-12 LAB — URINE CULTURE: SPECIAL REQUESTS: NORMAL

## 2018-04-12 MED ORDER — CEFAZOLIN SODIUM-DEXTROSE 2-4 GM/100ML-% IV SOLN
2.0000 g | INTRAVENOUS | Status: AC
  Administered 2018-04-13: 2 g via INTRAVENOUS

## 2018-04-12 MED ORDER — TRANEXAMIC ACID-NACL 1000-0.7 MG/100ML-% IV SOLN
1000.0000 mg | INTRAVENOUS | Status: AC
  Administered 2018-04-13: 1000 mg via INTRAVENOUS
  Filled 2018-04-12: qty 100

## 2018-04-13 ENCOUNTER — Inpatient Hospital Stay: Payer: Medicare Other

## 2018-04-13 ENCOUNTER — Encounter: Payer: Self-pay | Admitting: Orthopedic Surgery

## 2018-04-13 ENCOUNTER — Inpatient Hospital Stay: Payer: Medicare Other | Admitting: Anesthesiology

## 2018-04-13 ENCOUNTER — Encounter
Admission: RE | Disposition: A | Discharge status: Home Health | Payer: Self-pay | Source: Home / Self Care | Attending: Orthopedic Surgery

## 2018-04-13 ENCOUNTER — Inpatient Hospital Stay
Admission: RE | Admit: 2018-04-13 | Discharge: 2018-04-15 | DRG: 470 | Disposition: A | Discharge status: Home Health | Payer: Medicare Other | Attending: Orthopedic Surgery | Admitting: Orthopedic Surgery

## 2018-04-13 ENCOUNTER — Other Ambulatory Visit: Payer: Self-pay

## 2018-04-13 DIAGNOSIS — E7800 Pure hypercholesterolemia, unspecified: Secondary | ICD-10-CM | POA: Diagnosis present

## 2018-04-13 DIAGNOSIS — E785 Hyperlipidemia, unspecified: Secondary | ICD-10-CM | POA: Diagnosis present

## 2018-04-13 DIAGNOSIS — K219 Gastro-esophageal reflux disease without esophagitis: Secondary | ICD-10-CM | POA: Diagnosis present

## 2018-04-13 DIAGNOSIS — H409 Unspecified glaucoma: Secondary | ICD-10-CM | POA: Diagnosis present

## 2018-04-13 DIAGNOSIS — Z90710 Acquired absence of both cervix and uterus: Secondary | ICD-10-CM | POA: Diagnosis not present

## 2018-04-13 DIAGNOSIS — I10 Essential (primary) hypertension: Secondary | ICD-10-CM | POA: Diagnosis present

## 2018-04-13 DIAGNOSIS — M8580 Other specified disorders of bone density and structure, unspecified site: Secondary | ICD-10-CM | POA: Diagnosis present

## 2018-04-13 DIAGNOSIS — Z79899 Other long term (current) drug therapy: Secondary | ICD-10-CM | POA: Diagnosis not present

## 2018-04-13 DIAGNOSIS — Z9011 Acquired absence of right breast and nipple: Secondary | ICD-10-CM

## 2018-04-13 DIAGNOSIS — Z888 Allergy status to other drugs, medicaments and biological substances status: Secondary | ICD-10-CM | POA: Diagnosis not present

## 2018-04-13 DIAGNOSIS — Z823 Family history of stroke: Secondary | ICD-10-CM | POA: Diagnosis not present

## 2018-04-13 DIAGNOSIS — Z791 Long term (current) use of non-steroidal anti-inflammatories (NSAID): Secondary | ICD-10-CM | POA: Diagnosis not present

## 2018-04-13 DIAGNOSIS — M1711 Unilateral primary osteoarthritis, right knee: Principal | ICD-10-CM | POA: Diagnosis present

## 2018-04-13 DIAGNOSIS — Z79891 Long term (current) use of opiate analgesic: Secondary | ICD-10-CM | POA: Diagnosis not present

## 2018-04-13 DIAGNOSIS — Z853 Personal history of malignant neoplasm of breast: Secondary | ICD-10-CM

## 2018-04-13 DIAGNOSIS — Z96652 Presence of left artificial knee joint: Secondary | ICD-10-CM | POA: Diagnosis present

## 2018-04-13 DIAGNOSIS — Z96659 Presence of unspecified artificial knee joint: Secondary | ICD-10-CM

## 2018-04-13 HISTORY — PX: TOTAL KNEE ARTHROPLASTY: SHX125

## 2018-04-13 SURGERY — ARTHROPLASTY, KNEE, TOTAL
Anesthesia: Spinal | Site: Knee | Laterality: Right

## 2018-04-13 MED ORDER — METOCLOPRAMIDE HCL 10 MG PO TABS: 5.0000 mg | ORAL_TABLET | Freq: Three times a day (TID) | ORAL | Status: DC | PRN

## 2018-04-13 MED ORDER — OXYCODONE HCL 5 MG PO TABS: 5.0000 mg | ORAL_TABLET | Freq: Once | ORAL | Status: DC | PRN

## 2018-04-13 MED ORDER — TRAMADOL HCL 50 MG PO TABS
50.0000 mg | ORAL_TABLET | ORAL | Status: DC | PRN
  Administered 2018-04-13 – 2018-04-14 (×2): 50 mg via ORAL
  Administered 2018-04-14: 100 mg via ORAL
  Administered 2018-04-14 (×2): 50 mg via ORAL
  Administered 2018-04-15: 100 mg via ORAL
  Filled 2018-04-13 (×2): qty 1
  Filled 2018-04-13 (×2): qty 2
  Filled 2018-04-13 (×2): qty 1

## 2018-04-13 MED ORDER — BISACODYL 10 MG RE SUPP
10.0000 mg | Freq: Every day | RECTAL | Status: DC | PRN
  Administered 2018-04-15: 10 mg via RECTAL
  Filled 2018-04-13: qty 1

## 2018-04-13 MED ORDER — PHENOL 1.4 % MT LIQD
1.0000 | OROMUCOSAL | Status: DC | PRN
  Filled 2018-04-13: qty 177

## 2018-04-13 MED ORDER — BUPIVACAINE HCL (PF) 0.25 % IJ SOLN
INTRAMUSCULAR | Status: DC | PRN
  Administered 2018-04-13: 60 mL

## 2018-04-13 MED ORDER — TRANEXAMIC ACID-NACL 1000-0.7 MG/100ML-% IV SOLN
1000.0000 mg | Freq: Once | INTRAVENOUS | Status: AC
  Administered 2018-04-13: 1000 mg via INTRAVENOUS
  Filled 2018-04-13 (×2): qty 100

## 2018-04-13 MED ORDER — CELECOXIB 200 MG PO CAPS
ORAL_CAPSULE | ORAL | Status: AC
  Administered 2018-04-13: 400 mg via ORAL
  Filled 2018-04-13: qty 2

## 2018-04-13 MED ORDER — VITAMIN B-12 1000 MCG PO TABS
1000.0000 ug | ORAL_TABLET | Freq: Every day | ORAL | Status: DC
  Administered 2018-04-13 – 2018-04-15 (×3): 1000 ug via ORAL
  Filled 2018-04-13 (×3): qty 1

## 2018-04-13 MED ORDER — ACETAMINOPHEN 10 MG/ML IV SOLN
INTRAVENOUS | Status: AC
  Filled 2018-04-13: qty 100

## 2018-04-13 MED ORDER — ACETAMINOPHEN 10 MG/ML IV SOLN
INTRAVENOUS | Status: DC | PRN
  Administered 2018-04-13: 1000 mg via INTRAVENOUS

## 2018-04-13 MED ORDER — ACETAMINOPHEN 10 MG/ML IV SOLN
1000.0000 mg | Freq: Four times a day (QID) | INTRAVENOUS | Status: AC
  Administered 2018-04-13 – 2018-04-14 (×3): 1000 mg via INTRAVENOUS
  Filled 2018-04-13 (×4): qty 100

## 2018-04-13 MED ORDER — TETRACAINE HCL 1 % IJ SOLN
INTRAMUSCULAR | Status: AC
  Filled 2018-04-13: qty 2

## 2018-04-13 MED ORDER — PROPOFOL 10 MG/ML IV BOLUS
INTRAVENOUS | Status: DC | PRN
  Administered 2018-04-13: 30 mg via INTRAVENOUS

## 2018-04-13 MED ORDER — METOPROLOL SUCCINATE ER 25 MG PO TB24
25.0000 mg | ORAL_TABLET | Freq: Every day | ORAL | Status: DC
  Filled 2018-04-13: qty 1

## 2018-04-13 MED ORDER — OXYCODONE HCL 5 MG/5ML PO SOLN: 5.0000 mg | Freq: Once | ORAL | Status: DC | PRN

## 2018-04-13 MED ORDER — CELECOXIB 200 MG PO CAPS
200.0000 mg | ORAL_CAPSULE | Freq: Two times a day (BID) | ORAL | Status: DC
  Administered 2018-04-13 – 2018-04-15 (×4): 200 mg via ORAL
  Filled 2018-04-13 (×4): qty 1

## 2018-04-13 MED ORDER — GABAPENTIN 300 MG PO CAPS
300.0000 mg | ORAL_CAPSULE | Freq: Once | ORAL | Status: AC
  Administered 2018-04-13: 300 mg via ORAL

## 2018-04-13 MED ORDER — DIPHENHYDRAMINE HCL 12.5 MG/5ML PO ELIX: 12.5000 mg | ORAL_SOLUTION | ORAL | Status: DC | PRN

## 2018-04-13 MED ORDER — MAGNESIUM HYDROXIDE 400 MG/5ML PO SUSP
30.0000 mL | Freq: Every day | ORAL | Status: DC
  Administered 2018-04-13 – 2018-04-14 (×2): 30 mL via ORAL
  Filled 2018-04-13 (×2): qty 30

## 2018-04-13 MED ORDER — FAMOTIDINE 20 MG PO TABS
ORAL_TABLET | ORAL | Status: AC
  Administered 2018-04-13: 20 mg via ORAL
  Filled 2018-04-13: qty 1

## 2018-04-13 MED ORDER — SODIUM CHLORIDE 0.9 % IV SOLN
INTRAVENOUS | Status: DC | PRN
  Administered 2018-04-13: 40 ug/min via INTRAVENOUS

## 2018-04-13 MED ORDER — ROSUVASTATIN CALCIUM 10 MG PO TABS
20.0000 mg | ORAL_TABLET | Freq: Every day | ORAL | Status: DC
  Administered 2018-04-13 – 2018-04-14 (×2): 20 mg via ORAL
  Filled 2018-04-13 (×2): qty 2

## 2018-04-13 MED ORDER — DIMENHYDRINATE 50 MG PO TABS
50.0000 mg | ORAL_TABLET | Freq: Every evening | ORAL | Status: DC | PRN
  Administered 2018-04-13: 50 mg via ORAL
  Filled 2018-04-13 (×2): qty 1

## 2018-04-13 MED ORDER — SODIUM CHLORIDE 0.9 % IV SOLN
INTRAVENOUS | Status: DC
  Administered 2018-04-13: 17:00:00 via INTRAVENOUS

## 2018-04-13 MED ORDER — ENOXAPARIN SODIUM 30 MG/0.3ML ~~LOC~~ SOLN
30.0000 mg | Freq: Two times a day (BID) | SUBCUTANEOUS | Status: DC
  Administered 2018-04-14 – 2018-04-15 (×3): 30 mg via SUBCUTANEOUS
  Filled 2018-04-13 (×3): qty 0.3

## 2018-04-13 MED ORDER — MENTHOL 3 MG MT LOZG
1.0000 | LOZENGE | OROMUCOSAL | Status: DC | PRN
  Filled 2018-04-13: qty 9

## 2018-04-13 MED ORDER — CEFAZOLIN SODIUM-DEXTROSE 2-4 GM/100ML-% IV SOLN
2.0000 g | Freq: Four times a day (QID) | INTRAVENOUS | Status: AC
  Administered 2018-04-13 – 2018-04-14 (×4): 2 g via INTRAVENOUS
  Filled 2018-04-13 (×7): qty 100

## 2018-04-13 MED ORDER — DEXAMETHASONE SODIUM PHOSPHATE 10 MG/ML IJ SOLN
INTRAMUSCULAR | Status: AC
  Administered 2018-04-13: 8 mg via INTRAVENOUS
  Filled 2018-04-13: qty 1

## 2018-04-13 MED ORDER — LACTATED RINGERS IV SOLN
INTRAVENOUS | Status: DC
  Administered 2018-04-13 (×3): via INTRAVENOUS

## 2018-04-13 MED ORDER — GABAPENTIN 300 MG PO CAPS
ORAL_CAPSULE | ORAL | Status: AC
  Administered 2018-04-13: 300 mg via ORAL
  Filled 2018-04-13: qty 1

## 2018-04-13 MED ORDER — SODIUM CHLORIDE 0.9 % IV SOLN
INTRAVENOUS | Status: DC | PRN
  Administered 2018-04-13: 60 mL

## 2018-04-13 MED ORDER — BUPIVACAINE HCL (PF) 0.5 % IJ SOLN
INTRAMUSCULAR | Status: DC | PRN
  Administered 2018-04-13: 2.5 mL

## 2018-04-13 MED ORDER — SENNOSIDES-DOCUSATE SODIUM 8.6-50 MG PO TABS
1.0000 | ORAL_TABLET | Freq: Two times a day (BID) | ORAL | Status: DC
  Administered 2018-04-13 – 2018-04-15 (×4): 1 via ORAL
  Filled 2018-04-13 (×4): qty 1

## 2018-04-13 MED ORDER — FENTANYL CITRATE (PF) 100 MCG/2ML IJ SOLN
INTRAMUSCULAR | Status: AC
  Filled 2018-04-13: qty 2

## 2018-04-13 MED ORDER — CHLORHEXIDINE GLUCONATE 4 % EX LIQD: 60.0000 mL | Freq: Once | CUTANEOUS | Status: DC

## 2018-04-13 MED ORDER — CELECOXIB 200 MG PO CAPS
400.0000 mg | ORAL_CAPSULE | Freq: Once | ORAL | Status: AC
  Administered 2018-04-13: 400 mg via ORAL

## 2018-04-13 MED ORDER — FENTANYL CITRATE (PF) 100 MCG/2ML IJ SOLN
INTRAMUSCULAR | Status: DC | PRN
  Administered 2018-04-13: 25 ug via INTRAVENOUS

## 2018-04-13 MED ORDER — ROSUVASTATIN CALCIUM 10 MG PO TABS
20.0000 mg | ORAL_TABLET | Freq: Every day | ORAL | Status: DC
  Filled 2018-04-13: qty 2

## 2018-04-13 MED ORDER — FAMOTIDINE 20 MG PO TABS
20.0000 mg | ORAL_TABLET | Freq: Once | ORAL | Status: AC
  Administered 2018-04-13: 20 mg via ORAL

## 2018-04-13 MED ORDER — CEFAZOLIN SODIUM-DEXTROSE 2-4 GM/100ML-% IV SOLN
INTRAVENOUS | Status: AC
  Filled 2018-04-13: qty 100

## 2018-04-13 MED ORDER — TETRACAINE HCL 1 % IJ SOLN
INTRAMUSCULAR | Status: DC | PRN
  Administered 2018-04-13: 5 mg via INTRASPINAL

## 2018-04-13 MED ORDER — FLEET ENEMA 7-19 GM/118ML RE ENEM: 1.0000 | ENEMA | Freq: Once | RECTAL | Status: DC | PRN

## 2018-04-13 MED ORDER — MIDAZOLAM HCL 5 MG/5ML IJ SOLN
INTRAMUSCULAR | Status: DC | PRN
  Administered 2018-04-13: 1 mg via INTRAVENOUS

## 2018-04-13 MED ORDER — ONDANSETRON HCL 4 MG/2ML IJ SOLN
INTRAMUSCULAR | Status: DC | PRN
  Administered 2018-04-13: 4 mg via INTRAVENOUS

## 2018-04-13 MED ORDER — OXYCODONE HCL 5 MG PO TABS
10.0000 mg | ORAL_TABLET | ORAL | Status: DC | PRN
  Administered 2018-04-14 – 2018-04-15 (×5): 10 mg via ORAL
  Filled 2018-04-13 (×5): qty 2

## 2018-04-13 MED ORDER — POTASSIUM 99 MG PO TABS: 99.0000 mg | ORAL_TABLET | Freq: Every day | ORAL | Status: DC

## 2018-04-13 MED ORDER — NETARSUDIL-LATANOPROST 0.02-0.005 % OP SOLN: 1.0000 [drp] | Freq: Every day | OPHTHALMIC | Status: DC

## 2018-04-13 MED ORDER — SODIUM CHLORIDE 0.9 % IV SOLN
INTRAVENOUS | Status: DC | PRN
  Administered 2018-04-13: 13:00:00

## 2018-04-13 MED ORDER — PROPOFOL 500 MG/50ML IV EMUL
INTRAVENOUS | Status: DC | PRN
  Administered 2018-04-13: 70 ug/kg/min via INTRAVENOUS

## 2018-04-13 MED ORDER — METOPROLOL SUCCINATE ER 25 MG PO TB24
25.0000 mg | ORAL_TABLET | Freq: Every day | ORAL | Status: DC
  Administered 2018-04-14: 25 mg via ORAL
  Filled 2018-04-13 (×2): qty 1

## 2018-04-13 MED ORDER — ALUM & MAG HYDROXIDE-SIMETH 200-200-20 MG/5ML PO SUSP: 30.0000 mL | ORAL | Status: DC | PRN

## 2018-04-13 MED ORDER — HYDROMORPHONE HCL 1 MG/ML IJ SOLN: 0.5000 mg | INTRAMUSCULAR | Status: DC | PRN

## 2018-04-13 MED ORDER — POTASSIUM CHLORIDE CRYS ER 10 MEQ PO TBCR
5.0000 meq | EXTENDED_RELEASE_TABLET | Freq: Every day | ORAL | Status: DC
  Administered 2018-04-14: 5 meq via ORAL
  Filled 2018-04-13: qty 1

## 2018-04-13 MED ORDER — ONDANSETRON HCL 4 MG/2ML IJ SOLN: 4.0000 mg | Freq: Four times a day (QID) | INTRAMUSCULAR | Status: DC | PRN

## 2018-04-13 MED ORDER — MIDAZOLAM HCL 2 MG/2ML IJ SOLN
INTRAMUSCULAR | Status: AC
  Filled 2018-04-13: qty 2

## 2018-04-13 MED ORDER — PROPOFOL 500 MG/50ML IV EMUL
INTRAVENOUS | Status: AC
  Filled 2018-04-13: qty 50

## 2018-04-13 MED ORDER — METOCLOPRAMIDE HCL 10 MG PO TABS
10.0000 mg | ORAL_TABLET | Freq: Three times a day (TID) | ORAL | Status: DC
  Administered 2018-04-13 – 2018-04-15 (×7): 10 mg via ORAL
  Filled 2018-04-13 (×7): qty 1

## 2018-04-13 MED ORDER — ACETAMINOPHEN 325 MG PO TABS: 325.0000 mg | ORAL_TABLET | Freq: Four times a day (QID) | ORAL | Status: DC | PRN

## 2018-04-13 MED ORDER — POTASSIUM 99 MG PO TABS: 594.0000 mg | ORAL_TABLET | Freq: Every day | ORAL | Status: DC

## 2018-04-13 MED ORDER — ONDANSETRON HCL 4 MG PO TABS: 4.0000 mg | ORAL_TABLET | Freq: Four times a day (QID) | ORAL | Status: DC | PRN

## 2018-04-13 MED ORDER — GABAPENTIN 300 MG PO CAPS
300.0000 mg | ORAL_CAPSULE | Freq: Every day | ORAL | Status: DC
  Administered 2018-04-13 – 2018-04-14 (×2): 300 mg via ORAL
  Filled 2018-04-13 (×2): qty 1

## 2018-04-13 MED ORDER — PANTOPRAZOLE SODIUM 40 MG PO TBEC
40.0000 mg | DELAYED_RELEASE_TABLET | Freq: Two times a day (BID) | ORAL | Status: DC
  Administered 2018-04-13 – 2018-04-15 (×4): 40 mg via ORAL
  Filled 2018-04-13 (×4): qty 1

## 2018-04-13 MED ORDER — METOCLOPRAMIDE HCL 5 MG/ML IJ SOLN: 5.0000 mg | Freq: Three times a day (TID) | INTRAMUSCULAR | Status: DC | PRN

## 2018-04-13 MED ORDER — FERROUS SULFATE 325 (65 FE) MG PO TABS
325.0000 mg | ORAL_TABLET | Freq: Two times a day (BID) | ORAL | Status: DC
  Administered 2018-04-13 – 2018-04-15 (×4): 325 mg via ORAL
  Filled 2018-04-13 (×4): qty 1

## 2018-04-13 MED ORDER — VITAMIN D 25 MCG (1000 UNIT) PO TABS
1000.0000 [IU] | ORAL_TABLET | Freq: Every day | ORAL | Status: DC
  Administered 2018-04-13 – 2018-04-15 (×3): 1000 [IU] via ORAL
  Filled 2018-04-13 (×3): qty 1

## 2018-04-13 MED ORDER — PROPOFOL 10 MG/ML IV BOLUS
INTRAVENOUS | Status: AC
  Filled 2018-04-13: qty 20

## 2018-04-13 MED ORDER — FENTANYL CITRATE (PF) 100 MCG/2ML IJ SOLN: 25.0000 ug | INTRAMUSCULAR | Status: DC | PRN

## 2018-04-13 MED ORDER — DEXAMETHASONE SODIUM PHOSPHATE 10 MG/ML IJ SOLN
8.0000 mg | Freq: Once | INTRAMUSCULAR | Status: AC
  Administered 2018-04-13: 8 mg via INTRAVENOUS

## 2018-04-13 MED ORDER — OXYCODONE HCL 5 MG PO TABS
5.0000 mg | ORAL_TABLET | ORAL | Status: DC | PRN
  Administered 2018-04-14: 5 mg via ORAL
  Filled 2018-04-13: qty 1

## 2018-04-13 MED ORDER — BRINZOLAMIDE 1 % OP SUSP
1.0000 [drp] | Freq: Two times a day (BID) | OPHTHALMIC | Status: DC
  Administered 2018-04-13 – 2018-04-15 (×4): 1 [drp] via OPHTHALMIC
  Filled 2018-04-13: qty 10

## 2018-04-13 MED ORDER — GENTAMICIN SULFATE 40 MG/ML IJ SOLN
INTRAMUSCULAR | Status: AC
  Filled 2018-04-13: qty 4

## 2018-04-13 SURGICAL SUPPLY — 71 items
ATTUNE MED DOME PAT 32 KNEE (Knees) ×2 IMPLANT
ATTUNE MED DOME PAT 32MM KNEE (Knees) ×1 IMPLANT
ATTUNE PS FEM RT SZ 3 CEM KNEE (Femur) ×3 IMPLANT
ATTUNE PSRP INSR SZ3 6 KNEE (Insert) ×2 IMPLANT
ATTUNE PSRP INSR SZ3 6MM KNEE (Insert) ×1 IMPLANT
BASE TIBIAL ROT PLAT SZ 3 KNEE (Knees) ×1 IMPLANT
BATTERY INSTRU NAVIGATION (MISCELLANEOUS) ×12 IMPLANT
BLADE SAW 70X12.5 (BLADE) ×3 IMPLANT
BLADE SAW 90X13X1.19 OSCILLAT (BLADE) ×3 IMPLANT
BLADE SAW 90X25X1.19 OSCILLAT (BLADE) ×3 IMPLANT
BONE CEMENT GENTAMICIN (Cement) ×6 IMPLANT
CANISTER SUCT 1200ML W/VALVE (MISCELLANEOUS) ×3 IMPLANT
CANISTER SUCT 3000ML PPV (MISCELLANEOUS) ×6 IMPLANT
CEMENT BONE GENTAMICIN 40 (Cement) ×2 IMPLANT
COOLER POLAR GLACIER W/PUMP (MISCELLANEOUS) ×3 IMPLANT
COVER WAND RF STERILE (DRAPES) IMPLANT
CUFF TOURN 24 STER (MISCELLANEOUS) ×3 IMPLANT
CUFF TOURN 30 STER DUAL PORT (MISCELLANEOUS) IMPLANT
DRAPE SHEET LG 3/4 BI-LAMINATE (DRAPES) ×3 IMPLANT
DRSG DERMACEA 8X12 NADH (GAUZE/BANDAGES/DRESSINGS) ×3 IMPLANT
DRSG OPSITE POSTOP 4X14 (GAUZE/BANDAGES/DRESSINGS) ×3 IMPLANT
DRSG TEGADERM 4X4.75 (GAUZE/BANDAGES/DRESSINGS) ×3 IMPLANT
DURAPREP 26ML APPLICATOR (WOUND CARE) ×6 IMPLANT
ELECT CAUTERY BLADE 6.4 (BLADE) ×3 IMPLANT
ELECT REM PT RETURN 9FT ADLT (ELECTROSURGICAL) ×3
ELECTRODE REM PT RTRN 9FT ADLT (ELECTROSURGICAL) ×1 IMPLANT
EX-PIN ORTHOLOCK NAV 4X150 (PIN) ×6 IMPLANT
GLOVE BIOGEL M STRL SZ7.5 (GLOVE) ×6 IMPLANT
GLOVE BIOGEL PI IND STRL 7.5 (GLOVE) ×9 IMPLANT
GLOVE BIOGEL PI INDICATOR 7.5 (GLOVE) ×18
GLOVE INDICATOR 8.0 STRL GRN (GLOVE) ×3 IMPLANT
GOWN STRL REUS W/ TWL LRG LVL3 (GOWN DISPOSABLE) ×4 IMPLANT
GOWN STRL REUS W/TWL LRG LVL3 (GOWN DISPOSABLE) ×8
HEMOVAC 400CC 10FR (MISCELLANEOUS) ×3 IMPLANT
HOLDER FOLEY CATH W/STRAP (MISCELLANEOUS) ×3 IMPLANT
HOOD PEEL AWAY FLYTE STAYCOOL (MISCELLANEOUS) ×9 IMPLANT
KIT TURNOVER KIT A (KITS) ×3 IMPLANT
KNIFE SCULPS 14X20 (INSTRUMENTS) ×3 IMPLANT
LABEL OR SOLS (LABEL) ×3 IMPLANT
MANIFOLD NEPTUNE WASTE (CANNULA) ×3 IMPLANT
NDL SAFETY ECLIPSE 18X1.5 (NEEDLE) ×1 IMPLANT
NEEDLE HYPO 18GX1.5 SHARP (NEEDLE) ×2
NEEDLE SPNL 20GX3.5 QUINCKE YW (NEEDLE) ×6 IMPLANT
NS IRRIG 500ML POUR BTL (IV SOLUTION) ×3 IMPLANT
PACK TOTAL KNEE (MISCELLANEOUS) ×3 IMPLANT
PAD WRAPON POLAR KNEE (MISCELLANEOUS) ×1 IMPLANT
PENCIL SMOKE ULTRAEVAC 22 CON (MISCELLANEOUS) ×3 IMPLANT
PIN DRILL QUICK PACK ×3 IMPLANT
PIN FIXATION 1/8DIA X 3INL (PIN) ×9 IMPLANT
PULSAVAC PLUS IRRIG FAN TIP (DISPOSABLE) ×3
SOL .9 NS 3000ML IRR  AL (IV SOLUTION) ×2
SOL .9 NS 3000ML IRR UROMATIC (IV SOLUTION) ×1 IMPLANT
SOL PREP PVP 2OZ (MISCELLANEOUS) ×3
SOLUTION PREP PVP 2OZ (MISCELLANEOUS) ×1 IMPLANT
SPONGE DRAIN TRACH 4X4 STRL 2S (GAUZE/BANDAGES/DRESSINGS) ×3 IMPLANT
STAPLER SKIN PROX 35W (STAPLE) ×3 IMPLANT
STOCKINETTE IMPERV 14X48 (MISCELLANEOUS) IMPLANT
STRAP TIBIA SHORT (MISCELLANEOUS) ×3 IMPLANT
SUCTION FRAZIER HANDLE 10FR (MISCELLANEOUS) ×2
SUCTION TUBE FRAZIER 10FR DISP (MISCELLANEOUS) ×1 IMPLANT
SUT VIC AB 0 CT1 36 (SUTURE) ×6 IMPLANT
SUT VIC AB 1 CT1 36 (SUTURE) ×6 IMPLANT
SUT VIC AB 2-0 CT2 27 (SUTURE) ×3 IMPLANT
SYR 20CC LL (SYRINGE) ×3 IMPLANT
SYR 30ML LL (SYRINGE) ×6 IMPLANT
TIBIAL BASE ROT PLAT SZ 3 KNEE (Knees) ×3 IMPLANT
TIP FAN IRRIG PULSAVAC PLUS (DISPOSABLE) ×1 IMPLANT
TOWEL OR 17X26 4PK STRL BLUE (TOWEL DISPOSABLE) ×3 IMPLANT
TOWER CARTRIDGE SMART MIX (DISPOSABLE) ×3 IMPLANT
TRAY FOLEY MTR SLVR 16FR STAT (SET/KITS/TRAYS/PACK) ×3 IMPLANT
WRAPON POLAR PAD KNEE (MISCELLANEOUS) ×3

## 2018-04-13 NOTE — Op Note (Addendum)
OPERATIVE NOTE  DATE OF SURGERY:  04/13/2018  PATIENT NAME:  Katie Sheppard   DOB: May 27, 1945  MRN: [id number]  PRE-OPERATIVE DIAGNOSIS: Degenerative arthrosis of the right knee, primary  POST-OPERATIVE DIAGNOSIS:  Same  PROCEDURE:  Right total knee arthroplasty using computer-assisted navigation  SURGEON:  Marciano Sequin. M.D.  ASSISTANT:  Link Snuffer, PA-C (present and scrubbed throughout the case, critical for assistance with exposure, retraction, instrumentation, and closure)  ANESTHESIA: spinal  ESTIMATED BLOOD LOSS: 50 mL  FLUIDS REPLACED: 1300 mL of crystalloid  TOURNIQUET TIME: 80 minutes  DRAINS: 2 medium Hemovac drains  SOFT TISSUE RELEASES: Anterior cruciate ligament, posterior cruciate ligament, deep medial collateral ligament, patellofemoral ligament  IMPLANTS UTILIZED: DePuy Attune size 3 posterior stabilized femoral component (cemented), size 3 rotating platform tibial component (cemented), 32 mm medialized dome patella (cemented), and a 6 mm stabilized rotating platform polyethylene insert.  INDICATIONS FOR SURGERY: Katie Sheppard is a 73 y.o. year old female with a long history of progressive knee pain. X-rays demonstrated severe degenerative changes in tricompartmental fashion. The patient had not seen any significant improvement despite conservative nonsurgical intervention. After discussion of the risks and benefits of surgical intervention, the patient expressed understanding of the risks benefits and agree with plans for total knee arthroplasty.   The risks, benefits, and alternatives were discussed at length including but not limited to the risks of infection, bleeding, nerve injury, stiffness, blood clots, the need for revision surgery, cardiopulmonary complications, among others, and they were willing to proceed.  PROCEDURE IN DETAIL: The patient was brought into the operating room and, after adequate spinal anesthesia was achieved, a  tourniquet was placed on the patient's upper thigh. The patient's knee and leg were cleaned and prepped with alcohol and DuraPrep and draped in the usual sterile fashion. A "timeout" was performed as per usual protocol. The lower extremity was exsanguinated using an Esmarch, and the tourniquet was inflated to 300 mmHg. An anterior longitudinal incision was made followed by a standard mid vastus approach. The deep fibers of the medial collateral ligament were elevated in a subperiosteal fashion off of the medial flare of the tibia so as to maintain a continuous soft tissue sleeve. The patella was subluxed laterally and the patellofemoral ligament was incised. Inspection of the knee demonstrated severe degenerative changes with full-thickness loss of articular cartilage. Osteophytes were debrided using a rongeur. Anterior and posterior cruciate ligaments were excised. Two 4.0 mm Schanz pins were inserted in the femur and into the tibia for attachment of the array of trackers used for computer-assisted navigation. Hip center was identified using a circumduction technique. Distal landmarks were mapped using the computer. The distal femur and proximal tibia were mapped using the computer. The distal femoral cutting guide was positioned using computer-assisted navigation so as to achieve a 5 distal valgus cut. The femur was sized and it was felt that a size 3 femoral component was appropriate. A size 3 femoral cutting guide was positioned and the anterior cut was performed and verified using the computer. This was followed by completion of the posterior and chamfer cuts. Femoral cutting guide for the central box was then positioned in the center box cut was performed.  Attention was then directed to the proximal tibia. Medial and lateral menisci were excised. The extramedullary tibial cutting guide was positioned using computer-assisted navigation so as to achieve a 0 varus-valgus alignment and 3 posterior slope. The  cut was performed and verified using the computer. The proximal tibia  was sized and it was felt that a size 3 tibial tray was appropriate. Tibial and femoral trials were inserted followed by insertion of a 6 mm polyethylene insert. This allowed for excellent mediolateral soft tissue balancing both in flexion and in full extension. Finally, the patella was cut and prepared so as to accommodate a 32 mm medialized dome patella. A patella trial was placed and the knee was placed through a range of motion with excellent patellar tracking appreciated. The femoral trial was removed after debridement of posterior osteophytes. The central post-hole for the tibial component was reamed followed by insertion of a keel punch. Tibial trials were then removed. Cut surfaces of bone were irrigated with copious amounts of normal saline with antibiotic solution using pulsatile lavage and then suctioned dry. Polymethylmethacrylate cement was prepared in the usual fashion using a vacuum mixer. Cement was applied to the cut surface of the proximal tibia as well as along the undersurface of a size 3 rotating platform tibial component. Tibial component was positioned and impacted into place. Excess cement was removed using [employer] fast streamer. Cement was then applied to the cut surfaces of the femur as well as along the posterior flanges of the size 3 femoral component. The femoral component was positioned and impacted into place. Excess cement was removed using [employer] fast streamer. A 6 mm polyethylene trial was inserted and the knee was brought into full extension with steady axial compression applied. Finally, cement was applied to the backside of a 32 mm medialized dome patella and the patellar component was positioned and patellar clamp applied. Excess cement was removed using [employer] fast streamer. After adequate curing of the cement, the tourniquet was deflated after a total tourniquet time of 80 minutes. Hemostasis was achieved using  electrocautery. The knee was irrigated with copious amounts of normal saline with antibiotic solution using pulsatile lavage and then suctioned dry. 20 mL of 1.3% Exparel and 60 mL of 0.25% Marcaine in 40 mL of normal saline was injected along the posterior capsule, medial and lateral gutters, and along the arthrotomy site. A 6 mm stabilized rotating platform polyethylene insert was inserted and the knee was placed through a range of motion with excellent mediolateral soft tissue balancing appreciated and excellent patellar tracking noted. 2 medium drains were placed in the wound bed and brought out through separate stab incisions. The medial parapatellar portion of the incision was reapproximated using interrupted sutures of #1 Vicryl. Subcutaneous tissue was approximated in layers using first #0 Vicryl followed #2-0 Vicryl. The skin was approximated with skin staples. A sterile dressing was applied.  The patient tolerated the procedure well and was transported to the recovery room in stable condition.     P. Holley Bouche., M.D.

## 2018-04-13 NOTE — Transfer of Care (Signed)
Immediate Anesthesia Transfer of Care Note  Patient: Katie Sheppard  Procedure(s) Performed: TOTAL KNEE ARTHROPLASTY WITH NAVIGATION (Right Knee)  Patient Location: PACU  Anesthesia Type:Spinal  Level of Consciousness: awake, alert  and oriented  Airway & Oxygen Therapy: Patient Spontanous Breathing  Post-op Assessment: Report given to RN and Post -op Vital signs reviewed and stable  Post vital signs: Reviewed and stable  Last Vitals:  Vitals Value Taken Time  BP 111/78 04/13/2018  3:13 PM  Temp    Pulse 83 04/13/2018  3:16 PM  Resp 16 04/13/2018  3:16 PM  SpO2 99 % 04/13/2018  3:16 PM  Vitals shown include unvalidated device data.  Last Pain:  Vitals:   04/13/18 1513  TempSrc:   PainSc: (P) 0-No pain         Complications: No apparent anesthesia complications

## 2018-04-13 NOTE — Anesthesia Procedure Notes (Signed)
Spinal  Patient location during procedure: OR Start time: 04/13/2018 11:59 AM End time: 04/13/2018 12:02 PM Staffing Anesthesiologist: Piscitello, Precious Haws, MD Resident/CRNA: Hedda Slade, CRNA Performed: resident/CRNA  Preanesthetic Checklist Completed: patient identified, site marked, surgical consent, pre-op evaluation, timeout performed, IV checked, risks and benefits discussed and monitors and equipment checked Spinal Block Patient position: sitting Prep: ChloraPrep Patient monitoring: heart rate, continuous pulse ox, blood pressure and cardiac monitor Approach: midline Location: L3-4 Injection technique: single-shot Needle Needle type: Whitacre and Introducer  Needle gauge: 24 G Needle length: 9 cm Additional Notes Negative paresthesia. Negative blood return. Positive free-flowing CSF. Expiration date of kit checked and confirmed. Patient tolerated procedure well, without complications.

## 2018-04-13 NOTE — Anesthesia Preprocedure Evaluation (Signed)
Anesthesia Evaluation  Patient identified by MRN, date of birth, ID band Patient awake    Reviewed: Allergy & Precautions, H&P , NPO status , Patient's Chart, lab work & pertinent test results, reviewed documented beta blocker date and time   History of Anesthesia Complications Negative for: history of anesthetic complications  Airway Mallampati: II  TM Distance: >3 FB Neck ROM: limited    Dental  (+) Poor Dentition, Chipped   Pulmonary neg pulmonary ROS, neg shortness of breath,    Pulmonary exam normal        Cardiovascular Exercise Tolerance: Good hypertension, On Medications (-) angina(-) Past MI Normal cardiovascular exam Rhythm:regular Rate:Normal     Neuro/Psych negative neurological ROS  negative psych ROS   GI/Hepatic negative GI ROS, Neg liver ROS, neg GERD  ,  Endo/Other  negative endocrine ROS  Renal/GU negative Renal ROS  negative genitourinary   Musculoskeletal  (+) Arthritis ,   Abdominal   Peds  Hematology negative hematology ROS (+)   Anesthesia Other Findings Past Medical History: No date: Arthritis     Comment:  knees 01/15/96: Breast cancer ([place])     Comment:  right mastectomy No date: Dyspepsia No date: Hypercholesteremia No date: Hypertension No date: Wears contact lenses Past Surgical History: No date: ABDOMINAL HYSTERECTOMY 01/10/2016: CATARACT EXTRACTION W/PHACO; Right     Comment:  Procedure: CATARACT EXTRACTION PHACO AND INTRAOCULAR               LENS PLACEMENT ([place]);  Surgeon: Leandrew Koyanagi, MD;               Location: [facility];  Service: Ophthalmology;                Laterality: Right; No date: COLONOSCOPY No date: ESOPHAGOGASTRODUODENOSCOPY 01/15/96: MASTECTOMY; Right     Comment:  rt mastetomy No date: TUBAL LIGATION BMI    Body Mass Index:  24.02 kg/m     Reproductive/Obstetrics negative OB ROS                              Anesthesia Physical  Anesthesia Plan  ASA: III  Anesthesia Plan: Spinal   Post-op Pain Management:    Induction:   PONV Risk Score and Plan:   Airway Management Planned: Natural Airway and Nasal Cannula  Additional Equipment:   Intra-op Plan:   Post-operative Plan:   Informed Consent: I have reviewed the patients History and Physical, chart, labs and discussed the procedure including the risks, benefits and alternatives for the proposed anesthesia with the patient or authorized representative who has indicated his/her understanding and acceptance.     Dental Advisory Given  Plan Discussed with: CRNA  Anesthesia Plan Comments: (Patient reports no bleeding problems and no anticoagulant use.  Plan for spinal with backup GA  Patient consented for risks of anesthesia including but not limited to:  - adverse reactions to medications - risk of bleeding, infection, nerve damage and headache - risk of failed spinal - damage to teeth, lips or other oral mucosa - sore throat or hoarseness - Damage to heart, brain, lungs or loss of life  Patient voiced understanding.)        Anesthesia Quick Evaluation

## 2018-04-13 NOTE — H&P (Signed)
The patient has been re-examined, and the chart reviewed, and there have been no interval changes to the documented history and physical.    The risks, benefits, and alternatives have been discussed at length. The patient expressed understanding of the risks benefits and agreed with plans for surgical intervention.  James P. Hooten, Jr. M.D.    

## 2018-04-13 NOTE — NC FL2 (Signed)
[place] LEVEL OF CARE SCREENING TOOL     IDENTIFICATION  Patient Name: Katie Sheppard Birthdate: 1946/01/26 Sex: female Admission Date (Current Location): 04/13/2018  [place] and [place] Number:  Engineering geologist and Address:  [facility], [address], [place], [place] [address]      Provider Number: [id number]  Attending Physician Name and Address:  Dereck Leep, MD  Relative Name and Phone Number:       Current Level of Care: Hospital Recommended Level of Care: [place] Prior Approval Number:    Date Approved/Denied:   PASRR Number: [id number]  Discharge Plan: SNF    Current Diagnoses: Patient Active Problem List   Diagnosis Date Noted  . Total knee replacement status 04/13/2018  . Status post total left knee replacement 12/08/2017  . Dyspepsia 12/07/2017  . Hyperlipidemia 12/07/2017  . Hypertension 12/07/2017  . Osteopenia 12/07/2017  . Polycythemia 12/07/2017  . Primary osteoarthritis of right knee 10/10/2017  . History of breast cancer in female 03/19/2016    Orientation RESPIRATION BLADDER Height & Weight     Self, Time, Situation, Place  Normal Continent Weight: 126 lb (57.2 kg) Height:  5' (152.4 cm)  BEHAVIORAL SYMPTOMS/MOOD NEUROLOGICAL BOWEL NUTRITION STATUS  (none) (none) Continent Diet(Regular )  AMBULATORY STATUS COMMUNICATION OF NEEDS Skin   Extensive Assist Verbally Surgical wounds(Righ knee incision )                       Personal Care Assistance Level of Assistance  Bathing, Feeding, Dressing Bathing Assistance: Limited assistance Feeding assistance: Independent Dressing Assistance: Limited assistance     Functional Limitations Info  Sight, Hearing, Speech Sight Info: Adequate Hearing Info: Adequate Speech Info: Adequate    SPECIAL CARE FACTORS FREQUENCY  PT (By licensed PT), OT (By licensed OT)     PT Frequency: 5 OT Frequency: 5             Contractures Contractures Info: Not present    Additional Factors Info  Code Status, Allergies Code Status Info: Full Code  Allergies Info:  Dorzolamide Hcl-timolol Mal, Atorvastatin, Brimonidine Tartrate-timolol, Pravastatin           Current Medications (04/13/2018):  This is the current hospital active medication list Current Facility-Administered Medications  Medication Dose Route Frequency Provider Last Rate Last Dose  . 0.9 %  sodium chloride infusion   Intravenous Continuous Hooten, Laurice Record, MD 100 mL/hr at 04/13/18 1709    . acetaminophen (OFIRMEV) IV 1,000 mg  1,000 mg Intravenous Q6H Hooten, Laurice Record, MD      . Derrill Memo ON 04/14/2018] acetaminophen (TYLENOL) tablet 325-650 mg  325-650 mg Oral Q6H PRN Hooten, Laurice Record, MD      . alum & mag hydroxide-simeth (MAALOX/MYLANTA) 200-200-20 MG/5ML suspension 30 mL  30 mL Oral Q4H PRN Hooten, Laurice Record, MD      . bisacodyl (DULCOLAX) suppository 10 mg  10 mg Rectal Daily PRN Hooten, Laurice Record, MD      . brinzolamide (AZOPT) 1 % ophthalmic suspension 1 drop  1 drop Both Eyes BID Hooten, Laurice Record, MD      . ceFAZolin (ANCEF) IVPB 2g/100 mL premix  2 g Intravenous Q6H Hooten, Laurice Record, MD      . celecoxib (CELEBREX) capsule 200 mg  200 mg Oral BID Hooten, Laurice Record, MD      . cholecalciferol (VITAMIN D3) tablet 1,000 Units  1,000 Units Oral Daily Hooten,  Laurice Record, MD      . dimenhyDRINATE (DRAMAMINE) tablet 50 mg  50 mg Oral QHS PRN Hooten, Laurice Record, MD      . diphenhydrAMINE (BENADRYL) 12.5 MG/5ML elixir 12.5-25 mg  12.5-25 mg Oral Q4H PRN Hooten, Laurice Record, MD      . Derrill Memo ON 04/14/2018] enoxaparin (LOVENOX) injection 30 mg  30 mg Subcutaneous Q12H Hooten, Laurice Record, MD      . ferrous sulfate tablet 325 mg  325 mg Oral BID WC Hooten, Laurice Record, MD      . gabapentin (NEURONTIN) capsule 300 mg  300 mg Oral QHS Hooten, Laurice Record, MD      . HYDROmorphone (DILAUDID) injection 0.5-1 mg  0.5-1 mg Intravenous Q4H PRN Hooten, Laurice Record, MD      . magnesium hydroxide  (MILK OF MAGNESIA) suspension 30 mL  30 mL Oral Daily Hooten, Laurice Record, MD      . menthol-cetylpyridinium (CEPACOL) lozenge 3 mg  1 lozenge Oral PRN Hooten, Laurice Record, MD       Or  . phenol (CHLORASEPTIC) mouth spray 1 spray  1 spray Mouth/Throat PRN Hooten, Laurice Record, MD      . metoCLOPramide (REGLAN) tablet 5-10 mg  5-10 mg Oral Q8H PRN Hooten, Laurice Record, MD       Or  . metoCLOPramide (REGLAN) injection 5-10 mg  5-10 mg Intravenous Q8H PRN Hooten, Laurice Record, MD      . metoCLOPramide (REGLAN) tablet 10 mg  10 mg Oral TID AC & HS Hooten, Laurice Record, MD      . metoprolol succinate (TOPROL-XL) 24 hr tablet 25 mg  25 mg Oral Daily Hooten, Laurice Record, MD      . Netarsudil-Latanoprost 0.02-0.005 % SOLN 1 drop  1 drop Both Eyes QHS Hooten, Laurice Record, MD      . ondansetron (ZOFRAN) tablet 4 mg  4 mg Oral Q6H PRN Hooten, Laurice Record, MD       Or  . ondansetron (ZOFRAN) injection 4 mg  4 mg Intravenous Q6H PRN Hooten, Laurice Record, MD      . oxyCODONE (Oxy IR/ROXICODONE) immediate release tablet 10 mg  10 mg Oral Q4H PRN Hooten, Laurice Record, MD      . oxyCODONE (Oxy IR/ROXICODONE) immediate release tablet 5 mg  5 mg Oral Q4H PRN Hooten, Laurice Record, MD      . pantoprazole (PROTONIX) EC tablet 40 mg  40 mg Oral BID Hooten, Laurice Record, MD      . rosuvastatin (CRESTOR) tablet 20 mg  20 mg Oral Daily Hooten, Laurice Record, MD      . senna-docusate (Senokot-S) tablet 1 tablet  1 tablet Oral BID Hooten, Laurice Record, MD      . sodium phosphate (FLEET) 7-19 GM/118ML enema 1 enema  1 enema Rectal Once PRN Hooten, Laurice Record, MD      . traMADol (ULTRAM) tablet 50-100 mg  50-100 mg Oral Q4H PRN Hooten, Laurice Record, MD      . vitamin B-12 (CYANOCOBALAMIN) tablet 1,000 mcg  1,000 mcg Oral Daily Hooten, Laurice Record, MD         Discharge Medications: Please see discharge summary for a list of discharge medications.  Relevant Imaging Results:  Relevant Lab Results:   Additional Information SSN: [id number]  Annamaria Boots, [place]

## 2018-04-13 NOTE — Anesthesia Post-op Follow-up Note (Signed)
Anesthesia QCDR form completed.        

## 2018-04-14 ENCOUNTER — Encounter: Payer: Self-pay | Admitting: Orthopedic Surgery

## 2018-04-14 MED ORDER — ENOXAPARIN SODIUM 40 MG/0.4ML ~~LOC~~ SOLN: 40.0000 mg | SUBCUTANEOUS | 0 refills | Status: DC

## 2018-04-14 MED ORDER — OXYCODONE HCL 5 MG PO TABS: 5.0000 mg | ORAL_TABLET | ORAL | 0 refills | Status: DC | PRN

## 2018-04-14 MED ORDER — TRAMADOL HCL 50 MG PO TABS: 50.0000 mg | ORAL_TABLET | Freq: Four times a day (QID) | ORAL | 0 refills | Status: DC | PRN

## 2018-04-14 NOTE — Progress Notes (Signed)
Physical Therapy Treatment Patient Details Name: Katie Sheppard MRN: [id number] DOB: [birth year] Today's Date: 04/14/2018    History of Present Illness 73yo female s/p R TKA 2/10, PMHx includes L TKA (12/2017), dyspepsia, HLD, HTN, osteopenia, polycythemia, and breast cancer s/p R mastectomy.    PT Comments    Pt continues to do very well with POD1 PT.  She easily circumambulated the nurses station with consistent and safe cadence, with only minimal limp.  She showed good strength and effort with exercises, had ~100 degrees of flexion w/o a lot of pain.  Pt eager to go home tomorrow and appears to be on a good trajectory to do so.   Follow Up Recommendations  Home health PT     Equipment Recommendations  None recommended by PT    Recommendations for Other Services       Precautions / Restrictions Precautions Precautions: Knee;Fall Restrictions RLE Weight Bearing: Weight bearing as tolerated    Mobility  Bed Mobility               General bed mobility comments: in recliner on arrival and returned to recliner post session, not tested  Transfers Overall transfer level: Modified independent Equipment used: Rolling walker (2 wheeled) Transfers: Sit to/from Stand Sit to Stand: Supervision         General transfer comment: Pt able to rise to standing with good confidence and safety.  Ambulation/Gait Ambulation/Gait assistance: Min guard Gait Distance (Feet): 200 Feet Assistive device: Rolling walker (2 wheeled)       General Gait Details: Pt able to circumambualte the nurses' station; again with very little limp or hesitation and showed ability to maintain consistent cadence and speed t/o the effort.  Pt's vitals remained stable and ultimately she is doing very well with gait progress.   Stairs             Wheelchair Mobility    Modified Rankin (Stroke Patients Only)       Balance Overall balance assessment: Modified Independent                                           Cognition Arousal/Alertness: Awake/alert Behavior During Therapy: WFL for tasks assessed/performed Overall Cognitive Status: Within Functional Limits for tasks assessed                                        Exercises Total Joint Exercises Ankle Circles/Pumps: AROM;10 reps Quad Sets: Strengthening;10 reps Short Arc Quad: Strengthening;10 reps Heel Slides: Strengthening;10 reps Hip ABduction/ADduction: Strengthening;10 reps Straight Leg Raises: AROM;10 reps Long Arc Quad: Strengthening;10 reps Knee Flexion: PROM;5 reps    General Comments        Pertinent Vitals/Pain Pain Score: 2  Pain Location: R knee    Home Living                      Prior Function            PT Goals (current goals can now be found in the care plan section) Progress towards PT goals: Progressing toward goals    Frequency    BID      PT Plan      Co-evaluation  AM-PAC PT "6 Clicks" Mobility   Outcome Measure  Help needed turning from your back to your side while in a flat bed without using bedrails?: None Help needed moving from lying on your back to sitting on the side of a flat bed without using bedrails?: None Help needed moving to and from a bed to a chair (including a wheelchair)?: None Help needed standing up from a chair using your arms (e.g., wheelchair or bedside chair)?: None Help needed to walk in hospital room?: None Help needed climbing 3-5 steps with a railing? : A Little 6 Click Score: 23    End of Session Equipment Utilized During Treatment: Gait belt Activity Tolerance: Patient tolerated treatment well;Patient limited by pain Patient left: with chair alarm set;with call bell/phone within reach Nurse Communication: Mobility status PT Visit Diagnosis: Muscle weakness (generalized) (M62.81);Difficulty in walking, not elsewhere classified (R26.2);Pain Pain - Right/Left:  Right Pain - part of body: Knee     Time: 1423-1450 PT Time Calculation (min) (ACUTE ONLY): 27 min  Charges:  $Gait Training: 8-22 mins $Therapeutic Exercise: 8-22 mins                     Kreg Shropshire, DPT 04/14/2018, 3:31 PM

## 2018-04-14 NOTE — Evaluation (Signed)
Occupational Therapy Evaluation Patient Details Name: Katie Sheppard MRN: [id number] DOB: Dec 30, 1945 Today's Date: 04/14/2018    History of Present Illness 73yo female pt POD1 s/p R TKA, PMHx includes L TKA (12/2017), dyspepsia, HLD, HTN, osteopenia, polycythemia, and breast cancer s/p R mastectomy.   Clinical Impression   Pt seen for OT evaluation this date, POD#1 from above surgery. Pt was independent in all ADL prior to surgery following recovery of her L TKA in October, 2019. Pt is eager to return to PLOF with less pain and improved safety and independence. Pt currently requires PRN minimal assist for LB dressing to thread undergarment over R foot while in seated position due to pain and limited AROM of R knee. Pt able to perform bed mobility with modified independence, and CGA to Min A for initial STS transfer with RW. CGA to amb to recliner with 1 verbal cue for hand placement upon transfer. Pt/spouse instructed in polar care mgt, falls prevention strategies, home/routines modifications, DME/AE for LB bathing and dressing tasks, and compression stocking mgt. Pt/spouse verbalized understanding. Pt/family benefited maximally from OT evaluation and treatment. No further OT services indicated at this time. Will sign off. Do not currently anticipate any OT needs following this hospitalization. Please re-consult if additional needs arise during this hospitalization.    Follow Up Recommendations  No OT follow up    Equipment Recommendations  None recommended by OT    Recommendations for Other Services       Precautions / Restrictions Precautions Precautions: Knee Precaution Booklet Issued: No Precaution Comments: indep with SLR, no KI needed Restrictions Weight Bearing Restrictions: Yes RLE Weight Bearing: Weight bearing as tolerated      Mobility Bed Mobility Overal bed mobility: Modified Independent Bed Mobility: Supine to Sit     Supine to sit: Modified independent  (Device/Increase time)     General bed mobility comments: good speed, safety, no difficulty  Transfers Overall transfer level: Needs assistance Equipment used: Rolling walker (2 wheeled) Transfers: Sit to/from Stand Sit to Stand: Min guard;Min assist         General transfer comment: initial Min A to steady herself upon standing but physically able to push up from bed to standing     Balance Overall balance assessment: Mild deficits observed, not formally tested                                         ADL either performed or assessed with clinical judgement   ADL Overall ADL's : Needs assistance/impaired             Lower Body Bathing: Sit to/from stand;Minimal assistance;Min guard       Lower Body Dressing: Min guard;Minimal assistance;Sit to/from stand               Functional mobility during ADLs: Min guard;Rolling walker       Vision Baseline Vision/History: Wears glasses Wears Glasses: At all times Patient Visual Report: No change from baseline       Perception     Praxis      Pertinent Vitals/Pain Pain Assessment: 0-10 Pain Score: 2  Pain Location: R knee Pain Descriptors / Indicators: Aching Pain Intervention(s): Limited activity within patient's tolerance;Monitored during session;Premedicated before session;Ice applied;Repositioned     Hand Dominance Right   Extremity/Trunk Assessment Upper Extremity Assessment Upper Extremity Assessment: Overall WFL for tasks assessed   Lower  Extremity Assessment Lower Extremity Assessment: Overall WFL for tasks assessed(expected post-op strength/ROM deficits to R knee, but overall doing well)   Cervical / Trunk Assessment Cervical / Trunk Assessment: Normal   Communication Communication Communication: No difficulties   Cognition Arousal/Alertness: Awake/alert Behavior During Therapy: WFL for tasks assessed/performed Overall Cognitive Status: Within Functional Limits for tasks  assessed                                     General Comments       Exercises Other Exercises Other Exercises: pt/spouse instructed in falls prevention strategies including pet care considerations, AE/DME for ADL, polar care mgt, and compression stocking mgt. Both verbalized understanding of all education/training provided.   Shoulder Instructions      Home Living Family/patient expects to be discharged to:: Private residence Living Arrangements: Spouse/significant other Available Help at Discharge: Family Type of Home: House Home Access: Stairs to enter Technical brewer of Steps: 3 Entrance Stairs-Rails: None Home Layout: Able to live on main level with bedroom/bathroom;Two level     Bathroom Shower/Tub: Occupational psychologist: Standard     Home Equipment: Environmental consultant - 2 wheels;Cane - single point;Grab bars - tub/shower;Bedside commode;Wheelchair - manual          Prior Functioning/Environment          Comments: Per pt independent with all ADL, IADL, no falls, no AD for amb since recovery from previous L TKA        OT Problem List: Pain;Impaired balance (sitting and/or standing)      OT Treatment/Interventions:      OT Goals(Current goals can be found in the care plan section) Acute Rehab OT Goals Patient Stated Goal: to go home tomorrow OT Goal Formulation: All assessment and education complete, DC therapy  OT Frequency:     Barriers to D/C:            Co-evaluation              AM-PAC OT "6 Clicks" Daily Activity     Outcome Measure Help from another person eating meals?: None Help from another person taking care of personal grooming?: None Help from another person toileting, which includes using toliet, bedpan, or urinal?: A Little Help from another person bathing (including washing, rinsing, drying)?: A Little Help from another person to put on and taking off regular upper body clothing?: None Help from another  person to put on and taking off regular lower body clothing?: A Little 6 Click Score: 21   End of Session Equipment Utilized During Treatment: Gait belt;Rolling walker  Activity Tolerance: Patient tolerated treatment well Patient left: in chair;with call bell/phone within reach;with chair alarm set;Other (comment);with family/visitor present(rolled towel under R ankle, polar care in place)  OT Visit Diagnosis: Other abnormalities of gait and mobility (R26.89);Pain Pain - Right/Left: Right Pain - part of body: Knee                Time: 0156-1537 OT Time Calculation (min): 25 min Charges:  OT General Charges $OT Visit: 1 Visit OT Evaluation $OT Eval Low Complexity: 1 Low OT Treatments $Self Care/Home Management : 8-22 mins  Jeni Salles, MPH, MS, OTR/L ascom [phone] 04/14/18, 9:38 AM

## 2018-04-14 NOTE — Care Management Note (Signed)
Case Management Note  Patient Details  Name: Katie Sheppard MRN: [id number] Date of Birth: 01/02/1946  Subjective/Objective:                  Met with the patient to discuss DC plan and needs Has walker and bedside commode. [facility] [place] is the [facility] choice. [facility]  Was chosen and wants to use [facility] if possible, notified Helene Kelp at [facility] Patient has all of the DME at home that she needs, including Walker and [facility] Denies the need of any other DME needs Will check the price of Lovenox Before DC Lives with husband Husband will assist at home if needed  Transportation is when patient drives and her Husband provides transportation   Action/Plan:  [place] list p[roved to the partient per CMS.gov, chose [facility], notified Helene Kelp with [facility]  No DME needs  Expected Discharge Date:  04/15/18               Expected Discharge Plan:     In-House Referral:     Discharge planning Services  CM Consult  Post Acute Care Choice:    Choice offered to:     DME Arranged:    DME Agency:     HH Arranged:    [place] Agency:  [facility] [employer]  Status of Service:  In process, will continue to follow  If discussed at Long Length of Stay Meetings, dates discussed:    Additional Comments:  Su Hilt, RN 04/14/2018, 3:39 PM

## 2018-04-14 NOTE — Progress Notes (Signed)
Foley catheter discontinued w/o difficulty.  Pt. tolerated procedure well.

## 2018-04-14 NOTE — Discharge Summary (Signed)
Physician Discharge Summary  Sheppard ID: Katie Sheppard MRN: [id number] DOB/AGE: 1945/10/28 73 y.o.  Admit date: 04/13/2018 Discharge date: 04/15/2018  Admission Diagnoses:  Primary osteoarthritis of right knee   Discharge Diagnoses: Sheppard Active Problem List   Diagnosis Date Noted  . Total knee replacement status 04/13/2018  . Status post total left knee replacement 12/08/2017  . Dyspepsia 12/07/2017  . Hyperlipidemia 12/07/2017  . Hypertension 12/07/2017  . Osteopenia 12/07/2017  . Polycythemia 12/07/2017  . Primary osteoarthritis of right knee 10/10/2017  . History of breast cancer in female 03/19/2016    Past Medical History:  Diagnosis Date  . Arthritis    knees  . Breast cancer ([place]) 01/15/96   right mastectomy  . Dyspepsia   . Hypercholesteremia   . Hypertension   . Wears contact lenses      Transfusion: No transfusions during this admission   Consultants (if any):   Discharged Condition: Improved  Hospital Course: Katie Sheppard is an 73 y.o. female who was admitted 04/13/2018 with a diagnosis of degenerative arthrosis right knee and went to Katie operating room on 04/13/2018 and underwent Katie above named procedures.    Surgeries:Procedure(s): TOTAL KNEE ARTHROPLASTY WITH NAVIGATION on 04/13/2018  PRE-OPERATIVE DIAGNOSIS: Degenerative arthrosis of Katie right knee, primary  POST-OPERATIVE DIAGNOSIS:  Same  PROCEDURE:  Right total knee arthroplasty using computer-assisted navigation  SURGEON:  Marciano Sequin. M.D.  ASSISTANT:  Link Snuffer, PA-C (present and scrubbed throughout Katie case, critical for assistance with exposure, retraction, instrumentation, and closure)  ANESTHESIA: spinal  ESTIMATED BLOOD LOSS: 50 mL  FLUIDS REPLACED: 1300 mL of crystalloid  TOURNIQUET TIME: 80 minutes  DRAINS: 2 medium Hemovac drains  SOFT TISSUE RELEASES: Anterior cruciate ligament, posterior cruciate ligament, deep medial collateral  ligament, patellofemoral ligament  IMPLANTS UTILIZED: DePuy Attune size 3 posterior stabilized femoral component (cemented), size 3 rotating platform tibial component (cemented), 32 mm medialized dome patella (cemented), and a 6 mm stabilized rotating platform polyethylene insert.  INDICATIONS FOR SURGERY: Katie Sheppard is a 73 y.o. year old female with a long history of progressive knee pain. X-rays demonstrated severe degenerative changes in tricompartmental fashion. Katie Sheppard had not seen any significant improvement despite conservative nonsurgical intervention. After discussion of Katie risks and benefits of surgical intervention, Katie Sheppard expressed understanding of Katie risks benefits and agree with plans for total knee arthroplasty.   Katie risks, benefits, and alternatives were discussed at length including but not limited to Katie risks of infection, bleeding, nerve injury, stiffness, blood clots, Katie need for revision surgery, cardiopulmonary complications, among others, and they were willing to proceed. Sheppard tolerated Katie surgery well. No complications .Sheppard was taken to PACU where she was stabilized and then transferred to Katie orthopedic floor.  Sheppard started on Lovenox 30 mg q 12 hrs. Foot pumps applied bilaterally at 80 mm hgb. Heels elevated off bed with rolled towels. No evidence of DVT. Calves non tender. Negative Homan. Physical therapy started on day #1 for gait training and transfer with OT starting on  day #1 for ADL and assisted devices. Sheppard has done well with therapy. Ambulated greater than 200 feet upon being discharged.  Was able to ascend and descend 4 steps safely and independently  Sheppard's IV And Foley were discontinued on day #1 with Hemovac being discontinued on day #2. Dressing was changed on day 2 prior to Sheppard being discharged   She was given perioperative antibiotics:  Anti-infectives (From admission, onward)   Start  Dose/Rate Route  Frequency Ordered Stop   04/13/18 1800  ceFAZolin (ANCEF) IVPB 2g/100 mL premix     2 g 200 mL/hr over 30 Minutes Intravenous Every 6 hours 04/13/18 1643 04/14/18 1759   04/13/18 1238  gentamicin (GARAMYCIN) 80 mg in sodium chloride 0.9 % 500 mL irrigation  Status:  Discontinued       As needed 04/13/18 1341 04/13/18 1509   04/13/18 0945  ceFAZolin (ANCEF) 2-4 GM/100ML-% IVPB    Note to Pharmacy:  Sylvester Harder   : cabinet override      04/13/18 0945 04/13/18 1204   04/13/18 0600  ceFAZolin (ANCEF) IVPB 2g/100 mL premix     2 g 200 mL/hr over 30 Minutes Intravenous On call to O.R. 04/12/18 2157 04/13/18 1219    .  She was fitted with AV 1 compression foot pump devices, instructed on heel pumps, early ambulation, and fitted with TED stockings bilaterally for DVT prophylaxis.  She benefited maximally from Katie hospital stay and there were no complications.    Recent vital signs:  Vitals:   04/13/18 2316 04/14/18 0425  BP: 129/61 (!) 118/55  Pulse: 79 67  Resp: 17 17  Temp: 98.1 F (36.7 C) 98.1 F (36.7 C)  SpO2: 99% 97%    Recent laboratory studies:  Lab Results  Component Value Date   HGB 13.7 04/09/2018   HGB 14.2 11/26/2017   Lab Results  Component Value Date   WBC 7.3 04/09/2018   PLT 314 04/09/2018   Lab Results  Component Value Date   INR 0.90 04/09/2018   Lab Results  Component Value Date   NA 132 (L) 04/09/2018   K 3.8 04/09/2018   CL 98 04/09/2018   CO2 26 04/09/2018   BUN 10 04/09/2018   CREATININE 0.58 04/09/2018   GLUCOSE 100 (H) 04/09/2018    Discharge Medications:   Allergies as of 04/14/2018      Reactions   Dorzolamide Hcl-timolol Mal Swelling, Other (See Comments)   eyes   Atorvastatin Other (See Comments)   Unknown   Brimonidine Tartrate-timolol Swelling, Other (See Comments)   Eye irritation   Pravastatin Other (See Comments)   Unknown      Medication List    TAKE these medications   acetaminophen 650 MG CR tablet Commonly known  as:  TYLENOL Take 650 mg by mouth every 4 (four) hours as needed for pain.   brinzolamide 1 % ophthalmic suspension Commonly known as:  AZOPT Place 1 drop into both eyes 2 (two) times daily.   dimenhyDRINATE 50 MG tablet Commonly known as:  DRAMAMINE Take 50 mg by mouth at bedtime as needed for dizziness.   enoxaparin 40 MG/0.4ML injection Commonly known as:  LOVENOX Inject 0.4 mLs (40 mg total) into Katie skin daily for 14 days. Start taking on:  April 16, 2018   meloxicam 15 MG tablet Commonly known as:  MOBIC Take 7.5 mg by mouth 2 (two) times daily.   metoprolol succinate 25 MG 24 hr tablet Commonly known as:  TOPROL-XL Take 25 mg by mouth daily.   oxyCODONE 5 MG immediate release tablet Commonly known as:  Oxy IR/ROXICODONE Take 1 tablet (5 mg total) by mouth every 4 (four) hours as needed for moderate pain (pain score 4-6).   Potassium 99 MG Tabs Take 594 mg by mouth daily with lunch.   ROCKLATAN 0.02-0.005 % Soln Generic drug:  Netarsudil-Latanoprost Place 1 drop into both eyes at bedtime.   rosuvastatin 20 MG  tablet Commonly known as:  CRESTOR Take 20 mg by mouth daily.   traMADol 50 MG tablet Commonly known as:  ULTRAM Take 1-2 tablets (50-100 mg total) by mouth every 6 (six) hours as needed for moderate pain. What changed:    how much to take  when to take this   traMADol 50 MG tablet Commonly known as:  ULTRAM Take 1-2 tablets (50-100 mg total) by mouth every 6 (six) hours as needed for moderate pain. What changed:  You were already taking a medication with Katie same name, and this prescription was added. Make sure you understand how and when to take each.   vitamin B-12 1000 MCG tablet Commonly known as:  CYANOCOBALAMIN Take 1,000 mcg by mouth daily.   Vitamin D3 25 MCG (1000 UT) Caps Take 1,000 Units by mouth daily.            Durable Medical Equipment  (From admission, onward)         Start     Ordered   04/13/18 1644  DME Walker  rolling  Once    Question:  Sheppard needs a walker to treat with Katie following condition  Answer:  Total knee replacement status   04/13/18 1643   04/13/18 1644  DME Bedside commode  Once    Question:  Sheppard needs a bedside commode to treat with Katie following condition  Answer:  Total knee replacement status   04/13/18 1643          Diagnostic Studies: Dg Knee Right Port  Result Date: 04/13/2018 CLINICAL DATA:  Postop EXAM: PORTABLE RIGHT KNEE - 1-2 VIEW COMPARISON:  None. FINDINGS: RIGHT knee arthroplasty hardware appears appropriately positioned. Osseous alignment is anatomic. Expected postsurgical changes within Katie overlying soft tissues. IMPRESSION: Satisfactory appearance status post RIGHT knee arthroplasty. Electronically Signed   By: Franki Cabot M.D.   On: 04/13/2018 15:40    Disposition:     Follow-up Information    Watt Climes, PA On 04/28/2018.   Specialty:  Physician Assistant Why:  at 2:15pm Contact information: [place] [address] [phone]        Dereck Leep, MD On 05/26/2018.   Specialty:  Orthopedic Surgery Why:  at 9:30am Contact information: [place] [address] [phone]            Signed: Watt Climes 04/14/2018, 7:32 AM

## 2018-04-14 NOTE — Progress Notes (Signed)
   Subjective: 1 Day Post-Op Procedure(s) (LRB): TOTAL KNEE ARTHROPLASTY WITH NAVIGATION (Right) Patient reports pain as mild.  Had 2 tramadol around 330 this morning Patient is well, and has had no acute complaints or problems We will start therapy today.  Plan is to go Home after hospital stay. no nausea and no vomiting Patient denies any chest pains or shortness of breath.  No complaints other than that she did not sleep well during the night  Objective: Vital signs in last 24 hours: Temp:  [96.7 F (35.9 C)-98.1 F (36.7 C)] 98.1 F (36.7 C) (02/11 0425) Pulse Rate:  [63-87] 67 (02/11 0425) Resp:  [11-22] 17 (02/11 0425) BP: (111-165)/(55-81) 118/55 (02/11 0425) SpO2:  [97 %-100 %] 97 % (02/11 0425) Weight:  [57.2 kg] 57.2 kg (02/10 0955) Heels are non tender and elevated off the bed using rolled towels as well as bone foam under operative heel  Intake/Output from previous day: 02/10 0701 - 02/11 0700 In: 2855.1 [P.O.:1020; I.V.:1405.1; IV Piggyback:400] Out: 3491 [PHXTA:5697; Drains:130; Blood:50] Intake/Output this shift: No intake/output data recorded.  No results for input(s): HGB in the last 72 hours. No results for input(s): WBC, RBC, HCT, PLT in the last 72 hours. No results for input(s): NA, K, CL, CO2, BUN, CREATININE, GLUCOSE, CALCIUM in the last 72 hours. No results for input(s): LABPT, INR in the last 72 hours.  EXAM General - Patient is Alert, Appropriate and Oriented Extremity - Neurologically intact Neurovascular intact Sensation intact distally Intact pulses distally Dorsiflexion/Plantar flexion intact Compartment soft Dressing - dressing C/D/I Motor Function - intact, moving foot and toes well on exam.  Able to do straight leg raise on her own  Past Medical History:  Diagnosis Date  . Arthritis    knees  . Breast cancer ([place]) 01/15/96   right mastectomy  . Dyspepsia   . Hypercholesteremia   . Hypertension   . Wears contact lenses      Assessment/Plan: 1 Day Post-Op Procedure(s) (LRB): TOTAL KNEE ARTHROPLASTY WITH NAVIGATION (Right) Active Problems:   Total knee replacement status  Estimated body mass index is 24.61 kg/m as calculated from the following:   Height as of this encounter: 5' (1.524 m).   Weight as of this encounter: 57.2 kg. Advance diet Up with therapy D/C IV fluids Plan for discharge tomorrow Discharge home with home health  Labs: None DVT Prophylaxis - Lovenox, Foot Pumps and TED hose Weight-Bearing as tolerated to right leg D/C O2 and Pulse OX and try on Room Air Begin working on bowel movement  Katie Sheppard R. [place] 04/14/2018, 7:25 AM

## 2018-04-14 NOTE — Anesthesia Postprocedure Evaluation (Signed)
Anesthesia Post Note  Patient: Katie Sheppard  Procedure(s) Performed: TOTAL KNEE ARTHROPLASTY WITH NAVIGATION (Right Knee)  Patient location during evaluation: Nursing Unit Anesthesia Type: Spinal Level of consciousness: oriented and awake and alert Pain management: pain level controlled Vital Signs Assessment: post-procedure vital signs reviewed and stable Respiratory status: spontaneous breathing and respiratory function stable Cardiovascular status: blood pressure returned to baseline and stable Postop Assessment: no headache, no backache, no apparent nausea or vomiting and patient able to bend at knees Anesthetic complications: no     Last Vitals:  Vitals:   04/13/18 2316 04/14/18 0425  BP: 129/61 (!) 118/55  Pulse: 79 67  Resp: 17 17  Temp: 36.7 C 36.7 C  SpO2: 99% 97%    Last Pain:  Vitals:   04/14/18 0425  TempSrc: Oral  PainSc:                  Alison Stalling

## 2018-04-14 NOTE — Evaluation (Signed)
Physical Therapy Evaluation Patient Details Name: Katie Sheppard MRN: [id number] DOB: Aug 16, 1945 Today's Date: 04/14/2018   History of Present Illness  73yo female s/p R TKA 2/10, PMHx includes L TKA (12/2017), dyspepsia, HLD, HTN, osteopenia, polycythemia, and breast cancer s/p R mastectomy.   Pt did well with PT exam and showed good effort t/o the session.  She was able to participate well with ~10 minutes of supine bed exercises, had >90 degrees of flexion and was able to ambulate ~100 ft with community appropriate speed and minimal walker reliance.    Follow Up Recommendations Home health PT    Equipment Recommendations  None recommended by PT    Recommendations for Other Services       Precautions / Restrictions Precautions Precautions: Knee Precaution Booklet Issued: No Precaution Comments: indep with SLR, no KI needed Restrictions Weight Bearing Restrictions: Yes RLE Weight Bearing: Weight bearing as tolerated      Mobility  Bed Mobility Overal bed mobility: Modified Independent Bed Mobility: Supine to Sit     Supine to sit: Modified independent (Device/Increase time)     General bed mobility comments: in recliner on arrival, not tested  Transfers Overall transfer level: Modified independent Equipment used: Rolling walker (2 wheeled) Transfers: Sit to/from Stand Sit to Stand: Supervision         General transfer comment: Pt showed good effort with getting to standing, with minimal cuing to insure appropriate set up she was able to rise w/o assist showing good confidence  Ambulation/Gait Ambulation/Gait assistance: Min guard Gait Distance (Feet): 100 Feet Assistive device: Rolling walker (2 wheeled)       General Gait Details: Pt was able to quickly attain consistent cadence with no hesitation/limp and only minimal reliance on the walker.  She did report some increased pain with the effort, but ultimately did quite well.  Stairs             Wheelchair Mobility    Modified Rankin (Stroke Patients Only)       Balance Overall balance assessment: Modified Independent                                           Pertinent Vitals/Pain Pain Assessment: 0-10 Pain Score: 3 (increased with WBing and ROM tasks) Pain Location: R knee Pain Descriptors / Indicators: Aching Pain Intervention(s): Limited activity within patient's tolerance;Monitored during session;Premedicated before session;Ice applied;Repositioned    Home Living Family/patient expects to be discharged to:: Private residence Living Arrangements: Spouse/significant other Available Help at Discharge: Family Type of Home: House Home Access: Stairs to enter Entrance Stairs-Rails: None Entrance Stairs-Number of Steps: 3 Home Layout: Able to live on main level with bedroom/bathroom;Two level Home Equipment: Walker - 2 wheels;Cane - single point;Grab bars - tub/shower;Bedside commode;Wheelchair - manual      Prior Function Level of Independence: Independent with assistive device(s)         Comments: Pt had recovered well from L TKA 3 months ago, ambulating w/o AD, etc     Hand Dominance   Dominant Hand: Right    Extremity/Trunk Assessment   Upper Extremity Assessment Upper Extremity Assessment: Overall WFL for tasks assessed    Lower Extremity Assessment Lower Extremity Assessment: Overall WFL for tasks assessed    Cervical / Trunk Assessment Cervical / Trunk Assessment: Normal  Communication   Communication: No difficulties  Cognition Arousal/Alertness: Awake/alert Behavior During  Therapy: WFL for tasks assessed/performed Overall Cognitive Status: Within Functional Limits for tasks assessed                                        General Comments      Exercises Total Joint Exercises Ankle Circles/Pumps: AROM;10 reps Quad Sets: Strengthening;10 reps Hip ABduction/ADduction: Strengthening;10  reps Straight Leg Raises: AROM;10 reps Long Arc Quad: Strengthening;10 reps Knee Flexion: PROM;5 reps Goniometric ROM: 0-92 Other Exercises Other Exercises: pt/spouse instructed in falls prevention strategies including pet care considerations, AE/DME for ADL, polar care mgt, and compression stocking mgt. Both verbalized understanding of all education/training provided.   Assessment/Plan    PT Assessment Patient needs continued PT services  PT Problem List Decreased strength;Decreased range of motion;Decreased activity tolerance;Decreased mobility;Decreased coordination;Decreased knowledge of use of DME;Decreased safety awareness;Pain       PT Treatment Interventions DME instruction;Gait training;Stair training;Functional mobility training;Therapeutic activities;Therapeutic exercise;Neuromuscular re-education;Balance training;Patient/family education    PT Goals (Current goals can be found in the Care Plan section)  Acute Rehab PT Goals Patient Stated Goal: to go home tomorrow PT Goal Formulation: With patient Time For Goal Achievement: 04/28/18 Potential to Achieve Goals: Good    Frequency BID   Barriers to discharge        Co-evaluation               AM-PAC PT "6 Clicks" Mobility  Outcome Measure Help needed turning from your back to your side while in a flat bed without using bedrails?: None Help needed moving from lying on your back to sitting on the side of a flat bed without using bedrails?: None Help needed moving to and from a bed to a chair (including a wheelchair)?: None Help needed standing up from a chair using your arms (e.g., wheelchair or bedside chair)?: A Little Help needed to walk in hospital room?: A Little Help needed climbing 3-5 steps with a railing? : A Little 6 Click Score: 21    End of Session Equipment Utilized During Treatment: Gait belt Activity Tolerance: Patient tolerated treatment well;Patient limited by pain Patient left: with chair  alarm set;with call bell/phone within reach Nurse Communication: Mobility status PT Visit Diagnosis: Muscle weakness (generalized) (M62.81);Difficulty in walking, not elsewhere classified (R26.2);Pain Pain - Right/Left: Right Pain - part of body: Knee    Time: 1751-0258 PT Time Calculation (min) (ACUTE ONLY): 34 min   Charges:   PT Evaluation $PT Eval Low Complexity: 1 Low PT Treatments $Therapeutic Exercise: 8-22 mins        Kreg Shropshire, DPT 04/14/2018, 12:04 PM

## 2018-04-14 NOTE — Progress Notes (Signed)
Clinical Social Worker (CSW) received SNF consult. PT is recommending home health. RN case manager aware of above. Please reconsult if future social work needs arise. CSW signing off.   Huma Imhoff, LCSW [phone] 

## 2018-04-15 NOTE — Progress Notes (Signed)
   Subjective: 2 Days Post-Op Procedure(s) (LRB): TOTAL KNEE ARTHROPLASTY WITH NAVIGATION (Right) Patient reports pain as mild.   Patient is well, and has had no acute complaints or problems Patient did very well with therapy yesterday.  Was able to ambulate around the nurses test without any problems.  Had 100 degrees of flexion. Plan is to go Home after hospital stay. no nausea and no vomiting Patient denies any chest pains or shortness of breath.  Resting well.  Voicing no complaints  Objective: Vital signs in last 24 hours: Temp:  [97.7 F (36.5 C)-97.9 F (36.6 C)] 97.7 F (36.5 C) (02/12 0732) Pulse Rate:  [65-93] 65 (02/12 0732) Resp:  [18-19] 18 (02/12 0732) BP: (108-140)/(56-71) 140/56 (02/12 0732) SpO2:  [96 %-100 %] 96 % (02/12 0732) well approximated incision Heels are non tender and elevated off the bed using rolled towels Intake/Output from previous day: 02/11 0701 - 02/12 0700 In: 480 [P.O.:480] Out: 110 [Drains:110] Intake/Output this shift: No intake/output data recorded.  No results for input(s): HGB in the last 72 hours. No results for input(s): WBC, RBC, HCT, PLT in the last 72 hours. No results for input(s): NA, K, CL, CO2, BUN, CREATININE, GLUCOSE, CALCIUM in the last 72 hours. No results for input(s): LABPT, INR in the last 72 hours.  EXAM General - Patient is Alert, Appropriate and Oriented Extremity - Neurologically intact Neurovascular intact Sensation intact distally Intact pulses distally Dorsiflexion/Plantar flexion intact No cellulitis present Compartment soft Dressing - moderate drainage Motor Function - intact, moving foot and toes well on exam.    Past Medical History:  Diagnosis Date  . Arthritis    knees  . Breast cancer ([place]) 01/15/96   right mastectomy  . Dyspepsia   . Hypercholesteremia   . Hypertension   . Wears contact lenses     Assessment/Plan: 2 Days Post-Op Procedure(s) (LRB): TOTAL KNEE ARTHROPLASTY WITH  NAVIGATION (Right) Active Problems:   Total knee replacement status  Estimated body mass index is 24.61 kg/m as calculated from the following:   Height as of this encounter: 5' (1.524 m).   Weight as of this encounter: 57.2 kg. Up with therapy Discharge home with home health  Labs: None DVT Prophylaxis - Lovenox, Foot Pumps and TED hose Weight-Bearing as tolerated to right leg Hemovac discontinued today.  Into the drain appeared to be intact. Patient needs bowel movement prior to being discharged Please wash operative leg, change dressing and apply TED stockings to both legs prior to being discharged Be sure bone foam goes home with patient  Jillyn Ledger. [place] 04/15/2018, 7:32 AM

## 2018-04-15 NOTE — Progress Notes (Signed)
Discharge instructions given to pt. Prescriptions have been faxed to pharmacy. IV removed. Honeycomb dressing changed and two sent home with pt. Tech assisting pt with getting dressed and pt will discharge home with husband.

## 2018-04-15 NOTE — Care Management (Signed)
Called [facility] to check the price of Lovenox for the patient $108.61 Notified the patient of the cost

## 2018-04-15 NOTE — Progress Notes (Addendum)
Physical Therapy Treatment Patient Details Name: Katie Sheppard MRN: [id number] DOB: 07-06-1945 Today's Date: 04/15/2018    History of Present Illness 73yo female s/p R TKA 2/10, PMHx includes L TKA (12/2017), dyspepsia, HLD, HTN, osteopenia, polycythemia, and breast cancer s/p R mastectomy.    PT Comments    Pt in bed, reports some increased pain today but overall doing well.  Participated in exercises as described below.  Out of bed and ambulated around nursing unit and navigated stairs with walker.  Demonstrated safe gait with no LOB and good knowledge on how to navigate stairs with only assist to hold walker.  Pt with no further questions or concerns regarding mobility skills.  All PT goals met.   Follow Up Recommendations  Home health PT     Equipment Recommendations  None recommended by PT    Recommendations for Other Services       Precautions / Restrictions Precautions Precautions: Knee;Fall Precaution Comments: indep with SLR, no KI needed Restrictions Weight Bearing Restrictions: Yes RLE Weight Bearing: Weight bearing as tolerated    Mobility  Bed Mobility Overal bed mobility: Modified Independent                Transfers Overall transfer level: Modified independent                  Ambulation/Gait Ambulation/Gait assistance: Modified independent (Device/Increase time) Gait Distance (Feet): 200 Feet Assistive device: Rolling walker (2 wheeled) Gait Pattern/deviations: Step-through pattern     General Gait Details: steady and demonstrated safe gait   Stairs Stairs: Yes Stairs assistance: Modified independent (Device/Increase time) Stair Management: Backwards;With walker Number of Stairs: 4 General stair comments: Pt came with knowledge of how to navigate stairs with walker.  demonstrated with ease.   Wheelchair Mobility    Modified Rankin (Stroke Patients Only)       Balance Overall balance assessment: Modified  Independent                                          Cognition Arousal/Alertness: Awake/alert Behavior During Therapy: WFL for tasks assessed/performed Overall Cognitive Status: Within Functional Limits for tasks assessed                                        Exercises Total Joint Exercises Ankle Circles/Pumps: AROM;10 reps Quad Sets: Strengthening;10 reps Heel Slides: Strengthening;10 reps Straight Leg Raises: AROM;10 reps Long Arc Quad: Strengthening;10 reps Knee Flexion: PROM;5 reps Goniometric ROM: 0-95 limited by increased pain today but still tolerates well.    General Comments        Pertinent Vitals/Pain Pain Assessment: 0-10 Pain Score: 5  Pain Location: R knee Pain Descriptors / Indicators: Aching Pain Intervention(s): Limited activity within patient's tolerance;Monitored during session    Home Living                      Prior Function            PT Goals (current goals can now be found in the care plan section) Progress towards PT goals: Progressing toward goals    Frequency    BID      PT Plan Current plan remains appropriate    Co-evaluation  AM-PAC PT "6 Clicks" Mobility   Outcome Measure  Help needed turning from your back to your side while in a flat bed without using bedrails?: None Help needed moving from lying on your back to sitting on the side of a flat bed without using bedrails?: None Help needed moving to and from a bed to a chair (including a wheelchair)?: None Help needed standing up from a chair using your arms (e.g., wheelchair or bedside chair)?: None Help needed to walk in hospital room?: None Help needed climbing 3-5 steps with a railing? : A Little 6 Click Score: 23    End of Session Equipment Utilized During Treatment: Gait belt Activity Tolerance: Patient tolerated treatment well;Patient limited by pain Patient left: with chair alarm set;with call  bell/phone within reach;in chair Nurse Communication: Mobility status Pain - Right/Left: Right Pain - part of body: Knee     Time: 3143-8887 PT Time Calculation (min) (ACUTE ONLY): 15 min  Charges:  $Gait Training: 8-22 mins                     Chesley Noon, PTA 04/15/18, 9:20 AM

## 2018-05-11 ENCOUNTER — Encounter (INDEPENDENT_AMBULATORY_CARE_PROVIDER_SITE_OTHER): Payer: Self-pay

## 2018-05-11 ENCOUNTER — Ambulatory Visit: Payer: Medicare Other

## 2018-05-11 ENCOUNTER — Ambulatory Visit
Admission: RE | Admit: 2018-05-11 | Discharge: 2018-05-11 | Disposition: A | Discharge status: Home / Self Care | Payer: Medicare Other | Source: Ambulatory Visit | Attending: Internal Medicine | Admitting: Internal Medicine

## 2018-05-11 DIAGNOSIS — Z1231 Encounter for screening mammogram for malignant neoplasm of breast: Secondary | ICD-10-CM | POA: Diagnosis present

## 2019-04-07 ENCOUNTER — Other Ambulatory Visit: Payer: Self-pay | Admitting: Internal Medicine

## 2019-04-07 DIAGNOSIS — Z1231 Encounter for screening mammogram for malignant neoplasm of breast: Secondary | ICD-10-CM

## 2019-05-12 ENCOUNTER — Other Ambulatory Visit: Payer: Self-pay

## 2019-05-12 ENCOUNTER — Encounter (INDEPENDENT_AMBULATORY_CARE_PROVIDER_SITE_OTHER): Payer: Self-pay

## 2019-05-12 ENCOUNTER — Ambulatory Visit
Admission: RE | Admit: 2019-05-12 | Discharge: 2019-05-12 | Disposition: A | Discharge status: Home / Self Care | Payer: Medicare Other | Source: Ambulatory Visit | Attending: Internal Medicine | Admitting: Internal Medicine

## 2019-05-12 DIAGNOSIS — Z1231 Encounter for screening mammogram for malignant neoplasm of breast: Secondary | ICD-10-CM | POA: Diagnosis present

## 2020-04-03 ENCOUNTER — Other Ambulatory Visit: Payer: Self-pay | Admitting: Internal Medicine

## 2020-04-03 DIAGNOSIS — Z1231 Encounter for screening mammogram for malignant neoplasm of breast: Secondary | ICD-10-CM

## 2020-05-15 ENCOUNTER — Other Ambulatory Visit: Payer: Self-pay

## 2020-05-15 ENCOUNTER — Ambulatory Visit
Admission: RE | Admit: 2020-05-15 | Discharge: 2020-05-15 | Disposition: A | Discharge status: Home / Self Care | Payer: Medicare Other | Source: Ambulatory Visit | Attending: Internal Medicine | Admitting: Internal Medicine

## 2020-05-15 DIAGNOSIS — Z1231 Encounter for screening mammogram for malignant neoplasm of breast: Secondary | ICD-10-CM | POA: Insufficient documentation

## 2021-04-11 ENCOUNTER — Other Ambulatory Visit: Payer: Self-pay | Admitting: Internal Medicine

## 2021-04-11 DIAGNOSIS — Z1231 Encounter for screening mammogram for malignant neoplasm of breast: Secondary | ICD-10-CM

## 2021-06-06 ENCOUNTER — Ambulatory Visit
Admission: RE | Admit: 2021-06-06 | Discharge: 2021-06-06 | Disposition: A | Discharge status: Home / Self Care | Payer: Medicare Other | Source: Ambulatory Visit | Attending: Internal Medicine | Admitting: Internal Medicine

## 2021-06-06 DIAGNOSIS — Z1231 Encounter for screening mammogram for malignant neoplasm of breast: Secondary | ICD-10-CM | POA: Diagnosis not present

## 2021-12-06 ENCOUNTER — Encounter: Payer: Self-pay | Admitting: Ophthalmology

## 2021-12-10 NOTE — Discharge Instructions (Signed)

## 2021-12-12 ENCOUNTER — Ambulatory Visit
Admission: RE | Admit: 2021-12-12 | Discharge: 2021-12-12 | Disposition: A | Discharge status: Home / Self Care | Payer: Medicare Other | Attending: Ophthalmology | Admitting: Ophthalmology

## 2021-12-12 ENCOUNTER — Ambulatory Visit: Payer: Medicare Other | Admitting: General Practice

## 2021-12-12 ENCOUNTER — Other Ambulatory Visit: Payer: Self-pay

## 2021-12-12 ENCOUNTER — Encounter: Payer: Self-pay | Admitting: Ophthalmology

## 2021-12-12 ENCOUNTER — Encounter
Admission: RE | Disposition: A | Discharge status: Home / Self Care | Payer: Self-pay | Source: Home / Self Care | Attending: Ophthalmology

## 2021-12-12 DIAGNOSIS — H401113 Primary open-angle glaucoma, right eye, severe stage: Secondary | ICD-10-CM | POA: Diagnosis present

## 2021-12-12 DIAGNOSIS — I10 Essential (primary) hypertension: Secondary | ICD-10-CM | POA: Diagnosis not present

## 2021-12-12 HISTORY — PX: CATARACT EXTRACTION W/PHACO: SHX586

## 2021-12-12 SURGERY — PHACOEMULSIFICATION, CATARACT, WITH IOL INSERTION
Anesthesia: Monitor Anesthesia Care | Site: Eye | Laterality: Right

## 2021-12-12 MED ORDER — MIDAZOLAM HCL 2 MG/2ML IJ SOLN
INTRAMUSCULAR | Status: DC | PRN
  Administered 2021-12-12: 1 mg via INTRAVENOUS

## 2021-12-12 MED ORDER — CEFUROXIME OPHTHALMIC INJECTION 1 MG/0.1 ML
INJECTION | OPHTHALMIC | Status: DC | PRN
  Administered 2021-12-12: 1 mg via OPHTHALMIC

## 2021-12-12 MED ORDER — FENTANYL CITRATE (PF) 100 MCG/2ML IJ SOLN
INTRAMUSCULAR | Status: DC | PRN
  Administered 2021-12-12: 25 ug via INTRAVENOUS
  Administered 2021-12-12: 50 ug via INTRAVENOUS
  Administered 2021-12-12: 25 ug via INTRAVENOUS

## 2021-12-12 MED ORDER — TETRACAINE HCL 0.5 % OP SOLN
1.0000 [drp] | OPHTHALMIC | Status: DC | PRN
Start: 2021-12-12 — End: 2021-12-12
  Administered 2021-12-12: 1 [drp] via OPHTHALMIC

## 2021-12-12 MED ORDER — SIGHTPATH DOSE#1 BSS IO SOLN
INTRAOCULAR | Status: DC | PRN
  Administered 2021-12-12: 2 mL

## 2021-12-12 MED ORDER — SIGHTPATH DOSE#1 NA HYALUR & NA CHOND-NA HYALUR IO KIT
PACK | INTRAOCULAR | Status: DC | PRN
  Administered 2021-12-12: 1 via OPHTHALMIC

## 2021-12-12 MED ORDER — TETRACAINE HCL 0.5 % OP SOLN
OPHTHALMIC | Status: DC | PRN
  Administered 2021-12-12: 2 [drp] via OPHTHALMIC

## 2021-12-12 MED ORDER — SIGHTPATH DOSE#1 BSS IO SOLN
INTRAOCULAR | Status: DC | PRN
  Administered 2021-12-12: 15 mL
  Administered 2021-12-12: 15 mL via INTRAOCULAR

## 2021-12-12 SURGICAL SUPPLY — 13 items
CANNULA ANT/CHMB 27G (MISCELLANEOUS) IMPLANT
CANNULA ANT/CHMB 27GA (MISCELLANEOUS) IMPLANT
CATARACT SUITE SIGHTPATH (MISCELLANEOUS) ×1 IMPLANT
FEE CATARACT SUITE SIGHTPATH (MISCELLANEOUS) ×1 IMPLANT
GLOVE SRG 8 PF TXTR STRL LF DI (GLOVE) ×1 IMPLANT
GLOVE SURG ENC TEXT LTX SZ7.5 (GLOVE) ×1 IMPLANT
GLOVE SURG UNDER POLY LF SZ8 (GLOVE) ×1
ICLIP (OPHTHALMIC RELATED) IMPLANT
NDL FILTER BLUNT 18X1 1/2 (NEEDLE) ×1 IMPLANT
NEEDLE FILTER BLUNT 18X1 1/2 (NEEDLE) ×1 IMPLANT
SYR 3ML LL SCALE MARK (SYRINGE) ×1 IMPLANT
SYSTEM OMNI OPHTHALMIC STRL (OPHTHALMIC) IMPLANT
WATER STERILE IRR 250ML POUR (IV SOLUTION) ×1 IMPLANT

## 2021-12-12 NOTE — Op Note (Signed)
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    COMPLICATIONS:  None.   DESCRIPTION OF PROCEDURE:  The patient was identified in the holding room and transported to the operating room and placed in the supine position under the operating microscope.  The right eye was identified as the operative eye and it was prepped and draped in the usual sterile ophthalmic fashion.   A 1 millimeter clear-corneal paracentesis was made at the 12:00 position.  0.5 ml of preservative-free 1% lidocaine was injected into the anterior chamber.  The anterior chamber was filled with Viscoat viscoelastic.  A 2.4 millimeter keratome was used to make a near-clear corneal incision at the 9:00  position.    The microscope was adjusted and a gonioprism was used to visulaize the trabecular meshwork.  The Omni device was inserted and advanced toward the trabecular meshwork.  The cannula tip was used to gain access to Schlemm's canal.  The microcatheter was advanced into the superior angle for 180 degrees.  Upon retraction of the microcatheter, a controlled amount of Provisc was delivered into Schlemm's canal.  The cannula was withdrawn from the anterior chamber and additional Viscoat was placed into the eye.  The Omni was reinserted in the opposite direction to perform 180 degree canaloplasty of the inferior 180 degrees as noted above.  After canaloplasty was completed, the microcatheter was advanced into the canal for 180 degrees in the inferior angle.  The catheter was used to unroof the canal by gently withdrawing it while exiting the eye. Three clock hours were opened infero-nasally by  trabeculotomy. The remaining viscoelastic was aspirated.   Wounds were hydrated with balanced salt solution.  The anterior chamber was inflated to a physiologic pressure with balanced salt solution.  No wound leaks were noted. Cefuroxime 0.1 ml of a '10mg'$ /ml solution was injected into the anterior chamber for a dose of 1 mg of intracameral antibiotic at the completion of the case.   The patient was taken to the recovery room in stable condition without complications of anesthesia or surgery.

## 2021-12-12 NOTE — Anesthesia Postprocedure Evaluation (Signed)
Anesthesia Post Note  Patient: Katie Sheppard  Procedure(s) Performed: OMNI CANALOPLASTY / TRABECULOTOMY RIGHT (Right: Eye)  Patient location during evaluation: PACU Anesthesia Type: MAC Level of consciousness: awake and alert Pain management: pain level controlled Vital Signs Assessment: post-procedure vital signs reviewed and stable Respiratory status: spontaneous breathing, nonlabored ventilation and respiratory function stable Cardiovascular status: blood pressure returned to baseline and stable Postop Assessment: no apparent nausea or vomiting Anesthetic complications: no   There were no known notable events for this encounter.   Last Vitals:  Vitals:   12/12/21 1329 12/12/21 1330  BP:  115/76  Pulse: 74 69  Resp: 14 11  Temp:    SpO2: 99% 100%    Last Pain:  Vitals:   12/12/21 1330  TempSrc:   PainSc: 0-No pain                 [place] Ouch

## 2021-12-12 NOTE — Transfer of Care (Signed)
Immediate Anesthesia Transfer of Care Note  Patient: Katie Sheppard  Procedure(s) Performed: OMNI CANALOPLASTY / TRABECULOTOMY RIGHT (Right: Eye)  Patient Location: PACU  Anesthesia Type: MAC  Level of Consciousness: awake, alert  and patient cooperative  Airway and Oxygen Therapy: Patient Spontanous Breathing and Patient connected to supplemental oxygen  Post-op Assessment: Post-op Vital signs reviewed, Patient's Cardiovascular Status Stable, Respiratory Function Stable, Patent Airway and No signs of Nausea or vomiting  Post-op Vital Signs: Reviewed and stable  Complications: There were no known notable events for this encounter.

## 2021-12-12 NOTE — H&P (Signed)
[facility]   Primary Care Physician:  Adin Hector, MD Ophthalmologist: Dr. Leandrew Koyanagi  Pre-Procedure History & Physical: HPI:  Katie Sheppard is a 76 y.o. female here for ophthalmic surgery.   Past Medical History:  Diagnosis Date   Arthritis    knees   Breast cancer ([place]) 01/15/1996   right mastectomy   Dyspepsia    Hypercholesteremia    Hypertension     Past Surgical History:  Procedure Laterality Date   ABDOMINAL HYSTERECTOMY     CATARACT EXTRACTION W/PHACO Right 01/10/2016   Procedure: CATARACT EXTRACTION PHACO AND INTRAOCULAR LENS PLACEMENT ([place]);  Surgeon: Leandrew Koyanagi, MD;  Location: [place];  Service: Ophthalmology;  Laterality: Right;   COLONOSCOPY     ESOPHAGOGASTRODUODENOSCOPY     KNEE ARTHROPLASTY Left 12/08/2017   Procedure: COMPUTER ASSISTED TOTAL KNEE ARTHROPLASTY;  Surgeon: Dereck Leep, MD;  Location: [facility];  Service: Orthopedics;  Laterality: Left;   MASTECTOMY Right 01/15/96   rt mastetomy   TOTAL KNEE ARTHROPLASTY Right 04/13/2018   Procedure: TOTAL KNEE ARTHROPLASTY WITH NAVIGATION;  Surgeon: Dereck Leep, MD;  Location: [facility];  Service: Orthopedics;  Laterality: Right;   TUBAL LIGATION      Prior to Admission medications   Medication Sig Start Date End Date Taking? Authorizing Provider  acetaminophen (TYLENOL) 650 MG CR tablet Take 650 mg by mouth every 4 (four) hours as needed for pain.   Yes [provider]  brinzolamide (AZOPT) 1 % ophthalmic suspension Place 1 drop into both eyes 2 (two) times daily.   Yes [provider]  Cholecalciferol (VITAMIN D3) 25 MCG (1000 UT) CAPS Take 1,000 Units by mouth daily.   Yes [provider]  dimenhyDRINATE (DRAMAMINE) 50 MG tablet Take 50 mg by mouth at bedtime as needed for dizziness.   Yes [provider]  metoprolol succinate (TOPROL-XL) 25 MG 24 hr tablet Take 25 mg by mouth daily.   Yes [provider]   Netarsudil-Latanoprost (ROCKLATAN) 0.02-0.005 % SOLN Place 1 drop into both eyes at bedtime.   Yes [provider]  Potassium 99 MG TABS Take 594 mg by mouth daily with lunch.   Yes [provider]  rosuvastatin (CRESTOR) 20 MG tablet Take 20 mg by mouth daily.   Yes [provider]  vitamin B-12 (CYANOCOBALAMIN) 1000 MCG tablet Take 1,000 mcg by mouth daily.   Yes [provider]    Allergies as of 10/31/2021 - Review Complete 04/13/2018  Allergen Reaction Noted   Dorzolamide hcl-timolol mal Swelling and Other (See Comments) 01/08/2016   Atorvastatin Other (See Comments) 06/18/2016   Brimonidine tartrate-timolol Swelling and Other (See Comments) 08/30/2013   Pravastatin Other (See Comments) 06/18/2016    Family History  Problem Relation Age of Onset   Breast cancer Maternal Aunt        2 mat aunts    Social History   Socioeconomic History   Marital status: Married    Spouse name: Not on file   Number of children: Not on file   Years of education: Not on file   Highest education level: Not on file  Occupational History   Not on file  Tobacco Use   Smoking status: Never   Smokeless tobacco: Never  Vaping Use   Vaping Use: Never used  Substance and Sexual Activity   Alcohol use: Not Currently    Alcohol/week: 1.0 standard drink of alcohol    Types: 1 Glasses of wine  per week    Comment: occ   Drug use: Never   Sexual activity: Not on file  Other Topics Concern   Not on file  Social History Narrative   Not on file   Social Determinants of Health   Financial Resource Strain: Not on file  Food Insecurity: Not on file  Transportation Needs: Not on file  Physical Activity: Not on file  Stress: Not on file  Social Connections: Not on file  Intimate Partner Violence: Not on file    Review of Systems: See HPI, otherwise negative ROS  Physical Exam: BP 135/70   Temp 97.6 F (36.4 C) (Tympanic)   Resp 16   Ht 5' (1.524 m)    Wt 58.2 kg   SpO2 99%   BMI 25.08 kg/m  General:   Alert,  pleasant and cooperative in NAD Head:  Normocephalic and atraumatic. Lungs:  Clear to auscultation.    Heart:  Regular rate and rhythm.   Impression/Plan: Katie Sheppard is here for ophthalmic surgery.  Risks, benefits, limitations, and alternatives regarding ophthalmic surgery have been reviewed with the patient.  Questions have been answered.  All parties agreeable.   Leandrew Koyanagi, MD  12/12/2021, 12:24 PM

## 2021-12-12 NOTE — Anesthesia Preprocedure Evaluation (Addendum)
Anesthesia Evaluation  Patient identified by MRN, date of birth, ID band Patient awake    Reviewed: Allergy & Precautions, H&P , NPO status , Patient's Chart, lab work & pertinent test results, reviewed documented beta blocker date and time   History of Anesthesia Complications Negative for: history of anesthetic complications  Airway Mallampati: II  TM Distance: >3 FB Neck ROM: limited    Dental  (+) Poor Dentition   Pulmonary neg pulmonary ROS, neg shortness of breath,    Pulmonary exam normal        Cardiovascular Exercise Tolerance: Good hypertension, On Medications (-) angina(-) Past MI Normal cardiovascular exam Rhythm:regular Rate:Normal     Neuro/Psych negative neurological ROS  negative psych ROS   GI/Hepatic negative GI ROS, Neg liver ROS, neg GERD  ,  Endo/Other  negative endocrine ROS  Renal/GU negative Renal ROS  negative genitourinary   Musculoskeletal  (+) Arthritis ,   Abdominal Normal abdominal exam  (+)   Peds  Hematology negative hematology ROS (+)   Anesthesia Other Findings Past Medical History: No date: Arthritis     Comment:  knees 01/15/96: Breast cancer ([place])     Comment:  right mastectomy No date: Dyspepsia No date: Hypercholesteremia No date: Hypertension No date: Wears contact lenses Past Surgical History: No date: ABDOMINAL HYSTERECTOMY 01/10/2016: CATARACT EXTRACTION W/PHACO; Right     Comment:  Procedure: CATARACT EXTRACTION PHACO AND INTRAOCULAR               LENS PLACEMENT (IOC);  Surgeon: Leandrew Koyanagi, MD;               Location: [facility];  Service: Ophthalmology;                Laterality: Right; No date: COLONOSCOPY No date: ESOPHAGOGASTRODUODENOSCOPY 01/15/96: MASTECTOMY; Right     Comment:  rt mastetomy No date: TUBAL LIGATION BMI    Body Mass Index:  24.02 kg/m     Reproductive/Obstetrics negative OB ROS                             Anesthesia Physical  Anesthesia Plan  ASA: III  Anesthesia Plan: MAC   Post-op Pain Management:    Induction:   PONV Risk Score and Plan:   Airway Management Planned: Natural Airway and Nasal Cannula  Additional Equipment:   Intra-op Plan:   Post-operative Plan:   Informed Consent: I have reviewed the patients History and Physical, chart, labs and discussed the procedure including the risks, benefits and alternatives for the proposed anesthesia with the patient or authorized representative who has indicated his/her understanding and acceptance.       Plan Discussed with: CRNA  Anesthesia Plan Comments:        Anesthesia Quick Evaluation

## 2021-12-13 ENCOUNTER — Encounter: Payer: Self-pay | Admitting: Ophthalmology

## 2022-04-30 ENCOUNTER — Other Ambulatory Visit: Payer: Self-pay | Admitting: Internal Medicine

## 2022-04-30 DIAGNOSIS — Z1231 Encounter for screening mammogram for malignant neoplasm of breast: Secondary | ICD-10-CM

## 2022-06-10 ENCOUNTER — Ambulatory Visit
Admission: RE | Admit: 2022-06-10 | Discharge: 2022-06-10 | Disposition: A | Discharge status: Home / Self Care | Payer: Medicare Other | Source: Ambulatory Visit | Attending: Internal Medicine | Admitting: Internal Medicine

## 2022-06-10 DIAGNOSIS — Z1231 Encounter for screening mammogram for malignant neoplasm of breast: Secondary | ICD-10-CM | POA: Diagnosis present

## 2023-05-05 ENCOUNTER — Other Ambulatory Visit: Payer: Self-pay | Admitting: Internal Medicine

## 2023-05-05 DIAGNOSIS — Z1231 Encounter for screening mammogram for malignant neoplasm of breast: Secondary | ICD-10-CM

## 2023-06-11 ENCOUNTER — Ambulatory Visit
Admission: RE | Admit: 2023-06-11 | Discharge: 2023-06-11 | Disposition: A | Discharge status: Home / Self Care | Source: Ambulatory Visit | Attending: Internal Medicine | Admitting: Internal Medicine

## 2023-06-11 DIAGNOSIS — Z1231 Encounter for screening mammogram for malignant neoplasm of breast: Secondary | ICD-10-CM | POA: Insufficient documentation
